# Patient Record
Sex: Female | Born: 1935 | Race: White | Hispanic: No | Smoking: Former smoker
Health system: Southern US, Community
[De-identification: ages and names within clinical notes are randomized; demographics above are authoritative.]

## PROBLEM LIST (undated history)

## (undated) DIAGNOSIS — F419 Anxiety disorder, unspecified: Secondary | ICD-10-CM

## (undated) DIAGNOSIS — I4891 Unspecified atrial fibrillation: Secondary | ICD-10-CM

## (undated) DIAGNOSIS — I10 Essential (primary) hypertension: Secondary | ICD-10-CM

## (undated) DIAGNOSIS — K219 Gastro-esophageal reflux disease without esophagitis: Secondary | ICD-10-CM

## (undated) HISTORY — PX: CHOLECYSTECTOMY: SHX55

## (undated) HISTORY — PX: ABDOMINAL HYSTERECTOMY: SHX81

---

## 2005-06-21 ENCOUNTER — Ambulatory Visit: Payer: Self-pay | Admitting: Cardiology

## 2005-06-24 ENCOUNTER — Ambulatory Visit: Payer: Self-pay | Admitting: Cardiology

## 2006-09-13 ENCOUNTER — Ambulatory Visit (HOSPITAL_COMMUNITY): Admission: RE | Admit: 2006-09-13 | Discharge: 2006-09-13 | Payer: Self-pay | Admitting: Family Medicine

## 2006-10-05 ENCOUNTER — Ambulatory Visit: Payer: Self-pay | Admitting: Orthopedic Surgery

## 2006-11-28 ENCOUNTER — Ambulatory Visit (HOSPITAL_COMMUNITY): Admission: RE | Admit: 2006-11-28 | Discharge: 2006-11-28 | Payer: Self-pay | Admitting: Oral Surgery

## 2007-10-30 ENCOUNTER — Ambulatory Visit (HOSPITAL_COMMUNITY): Admission: RE | Admit: 2007-10-30 | Discharge: 2007-10-30 | Payer: Self-pay | Admitting: Family Medicine

## 2007-11-12 ENCOUNTER — Ambulatory Visit (HOSPITAL_COMMUNITY): Admission: RE | Admit: 2007-11-12 | Discharge: 2007-11-12 | Payer: Self-pay | Admitting: Orthopedic Surgery

## 2010-09-24 ENCOUNTER — Other Ambulatory Visit (HOSPITAL_COMMUNITY): Payer: Self-pay | Admitting: Family Medicine

## 2010-09-24 DIAGNOSIS — G629 Polyneuropathy, unspecified: Secondary | ICD-10-CM

## 2010-09-24 DIAGNOSIS — M1990 Unspecified osteoarthritis, unspecified site: Secondary | ICD-10-CM

## 2010-10-01 ENCOUNTER — Ambulatory Visit (HOSPITAL_COMMUNITY)
Admission: RE | Admit: 2010-10-01 | Discharge: 2010-10-01 | Disposition: A | Discharge status: Home / Self Care | Payer: Medicare Other | Source: Ambulatory Visit | Attending: Family Medicine | Admitting: Family Medicine

## 2010-10-01 DIAGNOSIS — M1990 Unspecified osteoarthritis, unspecified site: Secondary | ICD-10-CM

## 2010-10-01 DIAGNOSIS — M545 Low back pain, unspecified: Secondary | ICD-10-CM | POA: Insufficient documentation

## 2010-10-01 DIAGNOSIS — R209 Unspecified disturbances of skin sensation: Secondary | ICD-10-CM | POA: Insufficient documentation

## 2010-10-01 DIAGNOSIS — M519 Unspecified thoracic, thoracolumbar and lumbosacral intervertebral disc disorder: Secondary | ICD-10-CM | POA: Insufficient documentation

## 2010-10-01 DIAGNOSIS — M5137 Other intervertebral disc degeneration, lumbosacral region: Secondary | ICD-10-CM | POA: Insufficient documentation

## 2010-10-01 DIAGNOSIS — R609 Edema, unspecified: Secondary | ICD-10-CM | POA: Insufficient documentation

## 2010-10-01 DIAGNOSIS — M5126 Other intervertebral disc displacement, lumbar region: Secondary | ICD-10-CM | POA: Insufficient documentation

## 2010-10-01 DIAGNOSIS — G629 Polyneuropathy, unspecified: Secondary | ICD-10-CM

## 2010-10-01 DIAGNOSIS — M51379 Other intervertebral disc degeneration, lumbosacral region without mention of lumbar back pain or lower extremity pain: Secondary | ICD-10-CM | POA: Insufficient documentation

## 2010-11-08 ENCOUNTER — Other Ambulatory Visit: Payer: Self-pay | Admitting: Neurosurgery

## 2010-11-08 DIAGNOSIS — M47816 Spondylosis without myelopathy or radiculopathy, lumbar region: Secondary | ICD-10-CM

## 2010-11-09 ENCOUNTER — Ambulatory Visit
Admission: RE | Admit: 2010-11-09 | Discharge: 2010-11-09 | Disposition: A | Discharge status: Home / Self Care | Payer: Medicare Other | Source: Ambulatory Visit | Attending: Neurosurgery | Admitting: Neurosurgery

## 2010-11-09 DIAGNOSIS — M47816 Spondylosis without myelopathy or radiculopathy, lumbar region: Secondary | ICD-10-CM

## 2010-12-10 ENCOUNTER — Other Ambulatory Visit: Payer: Self-pay | Admitting: Neurosurgery

## 2010-12-10 DIAGNOSIS — M47816 Spondylosis without myelopathy or radiculopathy, lumbar region: Secondary | ICD-10-CM

## 2010-12-20 ENCOUNTER — Ambulatory Visit
Admission: RE | Admit: 2010-12-20 | Discharge: 2010-12-20 | Disposition: A | Discharge status: Home / Self Care | Payer: Medicare Other | Source: Ambulatory Visit | Attending: Neurosurgery | Admitting: Neurosurgery

## 2010-12-20 DIAGNOSIS — M47816 Spondylosis without myelopathy or radiculopathy, lumbar region: Secondary | ICD-10-CM

## 2010-12-24 NOTE — Op Note (Signed)
NAME:  Brandi [name], Brandi [name]                ACCOUNT NO.:  [id number]   MEDICAL RECORD NO.:  [id number]          PATIENT TYPE:  AMB   LOCATION:  SDS                          FACILITY:  [facility]   PHYSICIAN:  Hewitt Blade, D.D.S.DATE OF BIRTH:  July 17, 1929   DATE OF PROCEDURE:  11/28/2006  DATE OF DISCHARGE:  11/28/2006                               OPERATIVE REPORT   PREOPERATIVE DIAGNOSES:  Maxillary and mandibular nonrestorable teeth,  #4, 5, 6, 7, 8, 9, 10, 11, 12, 20, 21, 22, 23, 24, 25, 26, 27, 28, 29,  30; history of atrial fibrillation; history of supraventricular  tachycardia; history of congestive heart failure; history of  hypertension; history of shortness of breath; and history of nervous  disorders.   POSTOPERATIVE DIAGNOSES:  Maxillary and mandibular nonrestorable teeth,  #4, 5, 6, 7, 8, 9, 10, 11, 12, 20, 21, 22, 23, 24, 25, 26, 27, 28, 29,  30; history of atrial fibrillation; history of supraventricular  tachycardia; history of congestive heart failure; history of  hypertension; history of shortness of breath; and history of nervous  disorders.   SURGERY PERFORMED:  Total odontectomy and maxillary and mandibular  alveoloplasties.   SURGEON:  Hewitt Blade, D.D.S.   FIRST ASSISTANT:  Earlene Plater.   ANESTHESIA:  General via orotracheal intubation.   ESTIMATED BLOOD LOSS:  Less than 50 mL.   FLUID REPLACEMENT:  Approximately 1000 mL crystalloid solution.   COMPLICATIONS:  None apparent.   INDICATIONS FOR PROCEDURE:  Brandi [name] is a 77 year old female, who was  referred to my office for evaluation and removal of her remaining  dentition.  The patient has suffered from chronic dental neglect and  periodontal disease.  The teeth were deemed nonrestorable.  The patient  has a medical history complicating the surgery, including chronic heart  disease; and due to this, it was highly recommended that the patient  have the procedure performed in an operating room, where the  cardiac  status could be closely monitored and controlled.   DETAILS OF PROCEDURE:  On 11/28/06, Brandi [name] was taken to [facility]  Main Operating Suite, where she was placed on the operating room table  in a supine position.  Following successful oroendotracheal intubation  and general anesthesia, the patient's face, neck and oral cavity were  prepped and draped in the usual sterile operating room fashion.  The  hypopharynx was suctioned free of fluids and secretions, and a moistened  2-inch vaginal pack was placed as a throat pack.   Attention was then directed intraorally, where approximately 8 mL of 3%  mepivacaine was infiltrated around the maxillary, buccal and palatal  soft tissues, and the right and left inferior alveolar neurovascular  regions.  Approximately 2 mL of 0.5% Xylocaine containing 1:200,000 of  epinephrine was then infiltrated along the gingival margins of the  dentition for hemostasis purposes.   Attention was then directed towards the maxillary arch, where a #15 Bard-  Parker blade was used to create a full-thickness mucoperiosteal incision  around the buccal and lingual aspects of the gingival tissue surrounding  the teeth.  A #9 Molt  periosteal elevator was then used to reflect a  full-thickness mucoperiosteal flap.  The maxillary teeth were then  subluxated from the alveolus using an 11A elevator, and then removed  from the oral cavity using a 150 dental forceps.  Mild alveoloplasty was  then performed using a Stryker rotary osteotome and a small round bur.   In a similar fashion, the mandibular tissue was removed and  alveoloplasty was performed.  The surgical areas were then copiously  irrigated with sterile saline irrigation solutions and suctioned.  The  mucoperiosteal margins were then approximated in an anatomic fashion and  sutured using 4-0 chromic suture material.  Previously constructed  dentures were placed.  The throat pack was removed, and the  hypopharynx  suctioned free of fluids and secretions.  The patient was then allowed  to awaken from the anesthesia, and was taken to the recovery room, where  she tolerated the procedure well and without apparent complication.           ______________________________  Hewitt Blade, D.D.S.     DC/MEDQ  D:  12/01/2006  T:  12/01/2006  Job:  [id number]

## 2012-05-24 ENCOUNTER — Other Ambulatory Visit: Payer: Self-pay | Admitting: Family Medicine

## 2012-05-24 DIAGNOSIS — Z1231 Encounter for screening mammogram for malignant neoplasm of breast: Secondary | ICD-10-CM

## 2016-08-04 ENCOUNTER — Emergency Department (HOSPITAL_COMMUNITY)
Admission: EM | Admit: 2016-08-04 | Discharge: 2016-08-04 | Disposition: A | Discharge status: Home / Self Care | Payer: Medicare Other | Attending: Emergency Medicine | Admitting: Emergency Medicine

## 2016-08-04 ENCOUNTER — Emergency Department (HOSPITAL_COMMUNITY): Payer: Medicare Other

## 2016-08-04 ENCOUNTER — Encounter (HOSPITAL_COMMUNITY): Payer: Self-pay | Admitting: *Deleted

## 2016-08-04 DIAGNOSIS — Z79899 Other long term (current) drug therapy: Secondary | ICD-10-CM | POA: Diagnosis not present

## 2016-08-04 DIAGNOSIS — M419 Scoliosis, unspecified: Secondary | ICD-10-CM

## 2016-08-04 DIAGNOSIS — M79662 Pain in left lower leg: Secondary | ICD-10-CM | POA: Insufficient documentation

## 2016-08-04 DIAGNOSIS — Z7982 Long term (current) use of aspirin: Secondary | ICD-10-CM | POA: Insufficient documentation

## 2016-08-04 DIAGNOSIS — M79605 Pain in left leg: Secondary | ICD-10-CM

## 2016-08-04 DIAGNOSIS — Y999 Unspecified external cause status: Secondary | ICD-10-CM | POA: Diagnosis not present

## 2016-08-04 DIAGNOSIS — Z791 Long term (current) use of non-steroidal anti-inflammatories (NSAID): Secondary | ICD-10-CM | POA: Diagnosis not present

## 2016-08-04 DIAGNOSIS — Y939 Activity, unspecified: Secondary | ICD-10-CM | POA: Diagnosis not present

## 2016-08-04 DIAGNOSIS — Y92009 Unspecified place in unspecified non-institutional (private) residence as the place of occurrence of the external cause: Secondary | ICD-10-CM | POA: Diagnosis not present

## 2016-08-04 DIAGNOSIS — W06XXXA Fall from bed, initial encounter: Secondary | ICD-10-CM | POA: Diagnosis not present

## 2016-08-04 DIAGNOSIS — Z87891 Personal history of nicotine dependence: Secondary | ICD-10-CM | POA: Diagnosis not present

## 2016-08-04 DIAGNOSIS — G8929 Other chronic pain: Secondary | ICD-10-CM

## 2016-08-04 DIAGNOSIS — I10 Essential (primary) hypertension: Secondary | ICD-10-CM | POA: Insufficient documentation

## 2016-08-04 DIAGNOSIS — S30810A Abrasion of lower back and pelvis, initial encounter: Secondary | ICD-10-CM | POA: Diagnosis not present

## 2016-08-04 DIAGNOSIS — M41126 Adolescent idiopathic scoliosis, lumbar region: Secondary | ICD-10-CM | POA: Diagnosis not present

## 2016-08-04 DIAGNOSIS — W19XXXA Unspecified fall, initial encounter: Secondary | ICD-10-CM

## 2016-08-04 DIAGNOSIS — S8992XA Unspecified injury of left lower leg, initial encounter: Secondary | ICD-10-CM | POA: Diagnosis present

## 2016-08-04 HISTORY — DX: Gastro-esophageal reflux disease without esophagitis: K21.9

## 2016-08-04 HISTORY — DX: Essential (primary) hypertension: I10

## 2016-08-04 HISTORY — DX: Anxiety disorder, unspecified: F41.9

## 2016-08-04 HISTORY — DX: Unspecified atrial fibrillation: I48.91

## 2016-08-04 LAB — CBC WITH DIFFERENTIAL/PLATELET
Basophils Absolute: 0 10*3/uL (ref 0.0–0.1)
Basophils Relative: 0 %
Eosinophils Absolute: 0.1 10*3/uL (ref 0.0–0.7)
Eosinophils Relative: 1 %
HEMATOCRIT: 35.2 % — AB (ref 36.0–46.0)
HEMOGLOBIN: 12.1 g/dL (ref 12.0–15.0)
LYMPHS ABS: 1.3 10*3/uL (ref 0.7–4.0)
Lymphocytes Relative: 19 %
MCH: 32.7 pg (ref 26.0–34.0)
MCHC: 34.4 g/dL (ref 30.0–36.0)
MCV: 95.1 fL (ref 78.0–100.0)
MONOS PCT: 11 %
Monocytes Absolute: 0.7 10*3/uL (ref 0.1–1.0)
NEUTROS ABS: 4.8 10*3/uL (ref 1.7–7.7)
NEUTROS PCT: 69 %
Platelets: 202 10*3/uL (ref 150–400)
RBC: 3.7 MIL/uL — AB (ref 3.87–5.11)
RDW: 13.6 % (ref 11.5–15.5)
WBC: 6.8 10*3/uL (ref 4.0–10.5)

## 2016-08-04 LAB — BASIC METABOLIC PANEL
ANION GAP: 8 (ref 5–15)
BUN: 12 mg/dL (ref 6–20)
CHLORIDE: 107 mmol/L (ref 101–111)
CO2: 26 mmol/L (ref 22–32)
Calcium: 9 mg/dL (ref 8.9–10.3)
Creatinine, Ser: 0.88 mg/dL (ref 0.44–1.00)
GFR calc non Af Amer: 58 mL/min — ABNORMAL LOW (ref >60)
Glucose, Bld: 114 mg/dL — ABNORMAL HIGH (ref 65–99)
Potassium: 3.2 mmol/L — ABNORMAL LOW (ref 3.5–5.1)
SODIUM: 141 mmol/L (ref 135–145)

## 2016-08-04 LAB — TROPONIN I

## 2016-08-04 MED ORDER — LIDOCAINE 5 % EX PTCH: 1.0000 | MEDICATED_PATCH | CUTANEOUS | 0 refills | Status: AC

## 2016-08-04 MED ORDER — LIDOCAINE 5 % EX PTCH
1.0000 | MEDICATED_PATCH | CUTANEOUS | Status: DC
  Administered 2016-08-04: 1 via TRANSDERMAL
  Filled 2016-08-04: qty 1

## 2016-08-04 MED ORDER — LIDOCAINE 5 % EX PTCH
MEDICATED_PATCH | CUTANEOUS | Status: AC
  Filled 2016-08-04: qty 1

## 2016-08-04 MED ORDER — IRBESARTAN 75 MG PO TABS
75.0000 mg | ORAL_TABLET | Freq: Every day | ORAL | Status: DC
  Administered 2016-08-04: 75 mg via ORAL
  Filled 2016-08-04 (×2): qty 1

## 2016-08-04 MED ORDER — TRAMADOL HCL 50 MG PO TABS
50.0000 mg | ORAL_TABLET | Freq: Once | ORAL | Status: AC
Start: 2016-08-04 — End: 2016-08-04
  Administered 2016-08-04: 50 mg via ORAL
  Filled 2016-08-04: qty 1

## 2016-08-04 MED ORDER — POTASSIUM CHLORIDE CRYS ER 20 MEQ PO TBCR
40.0000 meq | EXTENDED_RELEASE_TABLET | Freq: Once | ORAL | Status: AC
  Administered 2016-08-04: 40 meq via ORAL
  Filled 2016-08-04: qty 2

## 2016-08-04 MED ORDER — ALPRAZOLAM 0.5 MG PO TABS
1.0000 mg | ORAL_TABLET | Freq: Once | ORAL | Status: AC
  Administered 2016-08-04: 1 mg via ORAL
  Filled 2016-08-04: qty 2

## 2016-08-04 NOTE — ED Triage Notes (Signed)
Pt comes in for a fall (out of bed) that occurred on 12/24. States she called EMS and they got her out of the floor. Pt lives at home alone. Pt states she is hurting from her left hip down to her left foot. Pt states she is unsure if she hit her head when she fell.

## 2016-08-04 NOTE — ED Notes (Addendum)
Pt states that she fell out of her bed.  Her sister states that she has a high bed.  Pt has been taken advil with no relief.  Pt has a bruise on her left ankle down to her foot.

## 2016-08-04 NOTE — ED Notes (Signed)
Pt up and ambulated to the bathroom with one assist

## 2016-08-04 NOTE — ED Provider Notes (Signed)
[facility]-EMERGENCY DEPT Provider Note   CSN: [id number] Arrival date & time: 08/04/16  1341     History   Chief Complaint Chief Complaint  Patient presents with  . Fall    HPI Brandi [name] is a 82 y.o. female.  HPI  Brandi [name] is a 82 y.o. female who presents to the Emergency Department complaining of pain, bruising and swelling of her left lower leg.  She states that she accidentally fell out of her bed, striking her left side on the floor.  Incident occurred 4 days ago.  She contacted 911, they assisted her up, but she was not evaluated for the fall, stating that she thought "it would get better".  She comes to ED today complaining of worsening pain to her left lower leg, hip and foot.  She has been taking advil without relief.  Patient's sister states that she lives at home alone and she was not aware of her sister's injuries until today.  Patient denies LOC, back or neck pain, dizziness, vomiting, abd or chest pain, numbness or weakness of the extremities.     Past Medical History:  Diagnosis Date  . A-fib (HCC)   . Anxiety   . GERD (gastroesophageal reflux disease)   . Hypertension     There are no active problems to display for this patient.   Past Surgical History:  Procedure Laterality Date  . ABDOMINAL HYSTERECTOMY    . CHOLECYSTECTOMY      OB History    No data available       Home Medications    Prior to Admission medications   Not on File    Family History No family history on file.  Social History Social History  Substance Use Topics  . Smoking status: Former Games developermoker  . Smokeless tobacco: Never Used  . Alcohol use No     Allergies   Other; Sulfa antibiotics; and Penicillins   Review of Systems Review of Systems  Constitutional: Negative for chills and fever.  Eyes: Negative for visual disturbance.  Respiratory: Negative for chest tightness, shortness of breath and wheezing.   Cardiovascular: Negative for chest pain.    Gastrointestinal: Negative for abdominal pain, nausea and vomiting.  Genitourinary: Negative for difficulty urinating, dysuria and flank pain.  Musculoskeletal: Positive for arthralgias and joint swelling.  Skin: Negative for color change and wound.       Abrasion left buttock  Neurological: Negative for dizziness, syncope, weakness, numbness and headaches.  Psychiatric/Behavioral: Negative for confusion.  All other systems reviewed and are negative.    Physical Exam Updated Vital Signs BP 171/100 (BP Location: Left Arm)   Pulse 63   Temp 99 F (37.2 C) (Oral)   Resp 20   Ht 5' (1.524 m)   Wt 61.2 kg   SpO2 100%   BMI 26.37 kg/m   Physical Exam  Constitutional: She appears well-developed and well-nourished. No distress.  HENT:  Head: Atraumatic.  Mouth/Throat: Oropharynx is clear and moist.  Eyes: Conjunctivae and EOM are normal. Pupils are equal, round, and reactive to light.  Neck: Normal range of motion. No JVD present.  Cardiovascular: Normal rate, regular rhythm and intact distal pulses.   Pulmonary/Chest: Effort normal and breath sounds normal. No stridor. No respiratory distress. She exhibits no tenderness.  Abdominal: Soft. She exhibits no distension and no mass. There is no tenderness. There is no guarding.  Musculoskeletal: She exhibits tenderness. She exhibits no deformity.  Diffuse ttp of the left lower extremity  and foot.  Ecchymosis present.  Mild edema of the distal tib-fib.  No bony deformity.  Mild tenderness of the lateral left hip without bony deformity, shortening or external rotation.  Distal sensation intact  Lymphadenopathy:    She has no cervical adenopathy.  Skin: Skin is warm. No erythema.  3 cm abrasion tot he left lower buttock.  No hematoma.  Nursing note and vitals reviewed.    ED Treatments / Results  Labs (all labs ordered are listed, but only abnormal results are displayed) Labs Reviewed - No data to display  EKG  EKG  Interpretation None       Radiology Dg Lumbar Spine Complete  Result Date: 08/04/2016 CLINICAL DATA:  Low back and left hip pain since falling from bed 3 days ago. EXAM: LUMBAR SPINE - COMPLETE 4+ VIEW COMPARISON:  Radiographs 10/30/2007.  MRI 10/01/2010. FINDINGS: Numbering is as applied previously. The last open disc space is assigned L5-S1. Utilizing this numbering, there are small ribs at L1. The bones are demineralized. There is progressive multilevel spondylosis with a thoracolumbar scoliosis. No evidence of acute fracture or traumatic subluxation. Aortic and branch vessel atherosclerosis noted. IMPRESSION: Progressive scoliosis and spondylosis. No acute osseous findings seen. Electronically Signed   By: Carey BullocksWilliam  Veazey M.D.   On: 08/04/2016 15:43   Dg Tibia/fibula Left  Result Date: 08/04/2016 CLINICAL DATA:  Left lower extremity pain after falling out of bed 3 days ago. EXAM: LEFT TIBIA AND FIBULA - 2 VIEW COMPARISON:  None. FINDINGS: There is no evidence of fracture or other focal bone lesions. Soft tissues are unremarkable. Severe degenerative changes seen involving the left knee. IMPRESSION: No significant abnormality seen involving the left tibia or fibula. Electronically Signed   By: Lupita RaiderJames  Green Jr, M.D.   On: 08/04/2016 15:45   Ct Head Wo Contrast  Result Date: 08/04/2016 CLINICAL DATA:  Fall from bed. Uncertain about head injury. Initial encounter. EXAM: CT HEAD WITHOUT CONTRAST TECHNIQUE: Contiguous axial images were obtained from the base of the skull through the vertex without intravenous contrast. COMPARISON:  None. FINDINGS: Brain: No evidence of acute infarction, hemorrhage, hydrocephalus, extra-axial collection or mass lesion/mass effect. Mild for age chronic microvascular ischemic change in the cerebral white matter. Vascular: Atherosclerotic calcification. Skull: Negative Sinuses/Orbits: Negative IMPRESSION: No evidence of intracranial injury or fracture. Electronically  Signed   By: Marnee SpringJonathon  Watts M.D.   On: 08/04/2016 15:52   Dg Foot Complete Left  Result Date: 08/04/2016 CLINICAL DATA:  Left foot pain after falling out of bed 3 days ago. EXAM: LEFT FOOT - COMPLETE 3+ VIEW COMPARISON:  None. FINDINGS: No fracture or dislocation is noted. Joint spaces are intact. Spurring of posterior calcaneus is noted. Mild degenerative joint disease of the first metatarsophalangeal joint is noted. No soft tissue abnormality is noted. IMPRESSION: Mild degenerative joint disease of first metatarsophalangeal joint. No acute abnormality seen in the left foot. Electronically Signed   By: Lupita RaiderJames  Green Jr, M.D.   On: 08/04/2016 15:49   Dg Hip Unilat W Or Wo Pelvis 2-3 Views Left  Result Date: 08/04/2016 CLINICAL DATA:  Left hip pain after falling out of bed 3 days ago. EXAM: DG HIP (WITH OR WITHOUT PELVIS) 2-3V LEFT COMPARISON:  None. FINDINGS: There is no evidence of hip fracture or dislocation. There is no evidence of arthropathy or other focal bone abnormality. IMPRESSION: Normal left hip. Electronically Signed   By: Lupita RaiderJames  Green Jr, M.D.   On: 08/04/2016 15:43    Procedures Procedures (  including critical care time)  Medications Ordered in ED Medications  traMADol (ULTRAM) tablet 50 mg (50 mg Oral Given 08/04/16 1454)     Initial Impression / Assessment and Plan / ED Course  I have reviewed the triage vital signs and the nursing notes.  Pertinent labs & imaging results that were available during my care of the patient were reviewed by me and considered in my medical decision making (see chart for details).  Clinical Course     1500 pt also seen by Dr. Adriana Simas and care plan discussed.   Reviewed XR's results with the patient and family.  Pt is well appearing.  NV intact.  Likely contusions.  Pt does live at home alone.  I have contacted social worker and arranged for home health.  Pt and family agree to plan.  Pt will f/u with PMD.  Restrict return precautions also  given.    Just prior to dispo, pt began to complain of "feeling funny" stating "I don't feel like I'm breathing like normal"  Pt is speaking in complete sentences, no respiratory distress noted, smiling and alert.  No distress noted.  I have discussed this with Dr. Rush Landmark and I will order labs, EKG, CXR and pt placed on cardiac monitor.    1800 End of shift, pt signed out to [employer], PA-C.      Final Clinical Impressions(s) / ED Diagnoses   Final diagnoses:  None    New Prescriptions New Prescriptions   No medications on file     Pauline Aus, PA-C 08/04/16 1809    Donnetta Hutching, MD 08/05/16 [phone]

## 2016-08-04 NOTE — Discharge Instructions (Signed)
You may benefit from seeing a back or pain specialist for your persistent low back and left leg pain.  Discuss this with your doctor. Arrangements have been made for a home assessment to see if you can be helped with physical therapy, home health aid or other resources to help make you safer in your home.  You will be contacted with an appointment time for this.

## 2016-08-06 ENCOUNTER — Emergency Department (HOSPITAL_COMMUNITY): Payer: Medicare Other

## 2016-08-06 ENCOUNTER — Inpatient Hospital Stay (HOSPITAL_COMMUNITY)
Admission: EM | Admit: 2016-08-06 | Discharge: 2016-08-15 | DRG: 551 | Disposition: A | Discharge status: Skilled Nursing Facility | Payer: Medicare Other | Attending: Internal Medicine | Admitting: Internal Medicine

## 2016-08-06 ENCOUNTER — Encounter (HOSPITAL_COMMUNITY): Payer: Self-pay

## 2016-08-06 DIAGNOSIS — K219 Gastro-esophageal reflux disease without esophagitis: Secondary | ICD-10-CM | POA: Diagnosis present

## 2016-08-06 DIAGNOSIS — W19XXXA Unspecified fall, initial encounter: Secondary | ICD-10-CM

## 2016-08-06 DIAGNOSIS — M4800 Spinal stenosis, site unspecified: Secondary | ICD-10-CM | POA: Diagnosis present

## 2016-08-06 DIAGNOSIS — I4891 Unspecified atrial fibrillation: Secondary | ICD-10-CM | POA: Diagnosis present

## 2016-08-06 DIAGNOSIS — Z79899 Other long term (current) drug therapy: Secondary | ICD-10-CM

## 2016-08-06 DIAGNOSIS — M549 Dorsalgia, unspecified: Secondary | ICD-10-CM | POA: Diagnosis present

## 2016-08-06 DIAGNOSIS — W06XXXA Fall from bed, initial encounter: Secondary | ICD-10-CM | POA: Diagnosis present

## 2016-08-06 DIAGNOSIS — M5116 Intervertebral disc disorders with radiculopathy, lumbar region: Principal | ICD-10-CM | POA: Diagnosis present

## 2016-08-06 DIAGNOSIS — M6281 Muscle weakness (generalized): Secondary | ICD-10-CM

## 2016-08-06 DIAGNOSIS — B379 Candidiasis, unspecified: Secondary | ICD-10-CM | POA: Diagnosis present

## 2016-08-06 DIAGNOSIS — Z888 Allergy status to other drugs, medicaments and biological substances status: Secondary | ICD-10-CM

## 2016-08-06 DIAGNOSIS — Z880 Allergy status to penicillin: Secondary | ICD-10-CM

## 2016-08-06 DIAGNOSIS — R410 Disorientation, unspecified: Secondary | ICD-10-CM | POA: Diagnosis present

## 2016-08-06 DIAGNOSIS — R4182 Altered mental status, unspecified: Secondary | ICD-10-CM

## 2016-08-06 DIAGNOSIS — M79605 Pain in left leg: Secondary | ICD-10-CM | POA: Diagnosis present

## 2016-08-06 DIAGNOSIS — Z882 Allergy status to sulfonamides status: Secondary | ICD-10-CM

## 2016-08-06 DIAGNOSIS — R110 Nausea: Secondary | ICD-10-CM | POA: Diagnosis not present

## 2016-08-06 DIAGNOSIS — Z87891 Personal history of nicotine dependence: Secondary | ICD-10-CM

## 2016-08-06 DIAGNOSIS — F419 Anxiety disorder, unspecified: Secondary | ICD-10-CM

## 2016-08-06 DIAGNOSIS — I10 Essential (primary) hypertension: Secondary | ICD-10-CM | POA: Diagnosis not present

## 2016-08-06 DIAGNOSIS — G92 Toxic encephalopathy: Secondary | ICD-10-CM | POA: Diagnosis present

## 2016-08-06 LAB — COMPREHENSIVE METABOLIC PANEL
ALT: 15 U/L (ref 14–54)
ANION GAP: 9 (ref 5–15)
AST: 23 U/L (ref 15–41)
Albumin: 4.2 g/dL (ref 3.5–5.0)
Alkaline Phosphatase: 51 U/L (ref 38–126)
BILIRUBIN TOTAL: 0.7 mg/dL (ref 0.3–1.2)
BUN: 13 mg/dL (ref 6–20)
CO2: 23 mmol/L (ref 22–32)
CREATININE: 0.86 mg/dL (ref 0.44–1.00)
Calcium: 9.1 mg/dL (ref 8.9–10.3)
Chloride: 103 mmol/L (ref 101–111)
GFR calc non Af Amer: 60 mL/min — ABNORMAL LOW (ref >60)
Glucose, Bld: 100 mg/dL — ABNORMAL HIGH (ref 65–99)
Potassium: 3.7 mmol/L (ref 3.5–5.1)
Sodium: 135 mmol/L (ref 135–145)
TOTAL PROTEIN: 6.5 g/dL (ref 6.5–8.1)

## 2016-08-06 LAB — CBC WITH DIFFERENTIAL/PLATELET
BASOS ABS: 0 10*3/uL (ref 0.0–0.1)
BASOS PCT: 0 %
EOS ABS: 0.1 10*3/uL (ref 0.0–0.7)
Eosinophils Relative: 1 %
HEMATOCRIT: 35.2 % — AB (ref 36.0–46.0)
HEMOGLOBIN: 12.3 g/dL (ref 12.0–15.0)
Lymphocytes Relative: 14 %
Lymphs Abs: 1 10*3/uL (ref 0.7–4.0)
MCH: 32.5 pg (ref 26.0–34.0)
MCHC: 34.9 g/dL (ref 30.0–36.0)
MCV: 92.9 fL (ref 78.0–100.0)
MONO ABS: 0.7 10*3/uL (ref 0.1–1.0)
MONOS PCT: 11 %
NEUTROS PCT: 74 %
Neutro Abs: 5.2 10*3/uL (ref 1.7–7.7)
Platelets: 209 10*3/uL (ref 150–400)
RBC: 3.79 MIL/uL — ABNORMAL LOW (ref 3.87–5.11)
RDW: 13.8 % (ref 11.5–15.5)
WBC: 7 10*3/uL (ref 4.0–10.5)

## 2016-08-06 LAB — URINALYSIS, ROUTINE W REFLEX MICROSCOPIC
BILIRUBIN URINE: NEGATIVE
Glucose, UA: NEGATIVE mg/dL
Hgb urine dipstick: NEGATIVE
KETONES UR: 5 mg/dL — AB
LEUKOCYTES UA: NEGATIVE
NITRITE: NEGATIVE
Protein, ur: NEGATIVE mg/dL
Specific Gravity, Urine: 1.009 (ref 1.005–1.030)
pH: 5 (ref 5.0–8.0)

## 2016-08-06 MED ORDER — MORPHINE SULFATE (PF) 4 MG/ML IV SOLN
4.0000 mg | Freq: Once | INTRAVENOUS | Status: AC
  Administered 2016-08-06: 4 mg via INTRAVENOUS
  Filled 2016-08-06: qty 1

## 2016-08-06 MED ORDER — DEXAMETHASONE 4 MG PO TABS
10.0000 mg | ORAL_TABLET | Freq: Four times a day (QID) | ORAL | Status: DC
  Administered 2016-08-06 – 2016-08-08 (×7): 10 mg via ORAL
  Filled 2016-08-06 (×7): qty 3

## 2016-08-06 MED ORDER — METHYLPREDNISOLONE SODIUM SUCC 125 MG IJ SOLR
125.0000 mg | Freq: Once | INTRAMUSCULAR | Status: AC
  Administered 2016-08-06: 125 mg via INTRAVENOUS
  Filled 2016-08-06: qty 2

## 2016-08-06 MED ORDER — FAMOTIDINE 20 MG PO TABS
20.0000 mg | ORAL_TABLET | Freq: Two times a day (BID) | ORAL | Status: DC
  Administered 2016-08-06 – 2016-08-08 (×5): 20 mg via ORAL
  Filled 2016-08-06 (×5): qty 1

## 2016-08-06 MED ORDER — METOPROLOL SUCCINATE ER 50 MG PO TB24
100.0000 mg | ORAL_TABLET | Freq: Every evening | ORAL | Status: DC
  Administered 2016-08-06 – 2016-08-14 (×8): 100 mg via ORAL
  Filled 2016-08-06 (×8): qty 2

## 2016-08-06 MED ORDER — MORPHINE SULFATE (PF) 2 MG/ML IV SOLN
1.0000 mg | INTRAVENOUS | Status: DC | PRN
  Administered 2016-08-08: 1 mg via INTRAVENOUS
  Filled 2016-08-06 (×2): qty 1

## 2016-08-06 MED ORDER — SODIUM CHLORIDE 0.9% FLUSH
3.0000 mL | Freq: Two times a day (BID) | INTRAVENOUS | Status: DC
  Administered 2016-08-06 – 2016-08-14 (×13): 3 mL via INTRAVENOUS

## 2016-08-06 MED ORDER — IBUPROFEN 400 MG PO TABS
400.0000 mg | ORAL_TABLET | Freq: Four times a day (QID) | ORAL | Status: DC
  Administered 2016-08-06 – 2016-08-08 (×10): 400 mg via ORAL
  Filled 2016-08-06 (×9): qty 1

## 2016-08-06 MED ORDER — SODIUM CHLORIDE 0.9 % IV SOLN: 250.0000 mL | INTRAVENOUS | Status: DC | PRN

## 2016-08-06 MED ORDER — MORPHINE SULFATE (PF) 2 MG/ML IV SOLN
2.0000 mg | Freq: Once | INTRAVENOUS | Status: AC
  Administered 2016-08-06: 2 mg via INTRAVENOUS
  Filled 2016-08-06: qty 1

## 2016-08-06 MED ORDER — ONDANSETRON HCL 4 MG/2ML IJ SOLN
4.0000 mg | Freq: Once | INTRAMUSCULAR | Status: AC
  Administered 2016-08-06: 4 mg via INTRAVENOUS
  Filled 2016-08-06: qty 2

## 2016-08-06 MED ORDER — ASPIRIN 81 MG PO CHEW
81.0000 mg | CHEWABLE_TABLET | Freq: Every day | ORAL | Status: DC
  Administered 2016-08-07 – 2016-08-11 (×4): 81 mg via ORAL
  Filled 2016-08-06 (×5): qty 1

## 2016-08-06 MED ORDER — ACETAMINOPHEN 650 MG RE SUPP: 650.0000 mg | Freq: Four times a day (QID) | RECTAL | Status: DC | PRN

## 2016-08-06 MED ORDER — ALPRAZOLAM 1 MG PO TABS
1.0000 mg | ORAL_TABLET | Freq: Two times a day (BID) | ORAL | Status: DC
  Administered 2016-08-06 – 2016-08-08 (×5): 1 mg via ORAL
  Filled 2016-08-06 (×5): qty 1

## 2016-08-06 MED ORDER — ENOXAPARIN SODIUM 40 MG/0.4ML ~~LOC~~ SOLN
40.0000 mg | SUBCUTANEOUS | Status: DC
  Administered 2016-08-06 – 2016-08-14 (×8): 40 mg via SUBCUTANEOUS
  Filled 2016-08-06 (×8): qty 0.4

## 2016-08-06 MED ORDER — LIDOCAINE 5 % EX PTCH
1.0000 | MEDICATED_PATCH | CUTANEOUS | Status: DC
  Administered 2016-08-07 – 2016-08-15 (×9): 1 via TRANSDERMAL
  Filled 2016-08-06 (×9): qty 1

## 2016-08-06 MED ORDER — VITAMIN D3 25 MCG (1000 UNIT) PO TABS
5000.0000 [IU] | ORAL_TABLET | Freq: Every day | ORAL | Status: DC
  Administered 2016-08-07 – 2016-08-15 (×7): 5000 [IU] via ORAL
  Filled 2016-08-06 (×8): qty 5

## 2016-08-06 MED ORDER — IRBESARTAN 75 MG PO TABS
75.0000 mg | ORAL_TABLET | Freq: Every day | ORAL | Status: DC
  Administered 2016-08-08 – 2016-08-15 (×7): 75 mg via ORAL
  Filled 2016-08-06 (×9): qty 1

## 2016-08-06 MED ORDER — SODIUM CHLORIDE 0.9% FLUSH: 3.0000 mL | INTRAVENOUS | Status: DC | PRN

## 2016-08-06 MED ORDER — ADULT MULTIVITAMIN W/MINERALS CH
1.0000 | ORAL_TABLET | Freq: Every day | ORAL | Status: DC
  Administered 2016-08-07 – 2016-08-15 (×8): 1 via ORAL
  Filled 2016-08-06 (×9): qty 1

## 2016-08-06 MED ORDER — B COMPLEX-C PO TABS
1.0000 | ORAL_TABLET | Freq: Every day | ORAL | Status: DC
  Administered 2016-08-07 – 2016-08-15 (×8): 1 via ORAL
  Filled 2016-08-06 (×9): qty 1

## 2016-08-06 MED ORDER — ACETAMINOPHEN 325 MG PO TABS
650.0000 mg | ORAL_TABLET | Freq: Four times a day (QID) | ORAL | Status: DC | PRN
  Administered 2016-08-08 – 2016-08-14 (×8): 650 mg via ORAL
  Filled 2016-08-06 (×9): qty 2

## 2016-08-06 NOTE — ED Notes (Signed)
Bed: WA17 Expected date:  Expected time:  Means of arrival:  Comments: 82 yo leg pain x6 days

## 2016-08-06 NOTE — ED Triage Notes (Signed)
She c/o left leg pain (her entire left leg, including hip and lower left flank area) which she states she has had "for a long time". She also tells me she fell Christmas Eve. At home, and afterward "the pain was worse". She arrives here awake, alert and in no distress. She was seen at [facility] after the fall and underwent blood testing and x-rays.

## 2016-08-06 NOTE — ED Provider Notes (Signed)
[facility]-EMERGENCY DEPT Provider Note   CSN: [id number] Arrival date & time: 08/06/16  1100     History   Chief Complaint Chief Complaint  Patient presents with  . Fall  . Leg Pain    HPI Brandi [name] is a 82 y.o. female.  The history is provided by the patient and a relative. No language interpreter was used.  Fall   Leg Pain     Brandi [name] is a 82 y.o. female who presents to the Emergency Department complaining of leg pain.  She presents for evaluation of left leg and back pain that occurred after a fall on Christmas Eve. She was in bed when she rolled out of bed and woke up on the floor. She's had severe pain in her low back and bilateral legs, left greater than right since that time. She was seen in the emergency department 2 days ago for persistent worsening symptoms and had x-rays performed and was discharged home. She states there is no relief with her medications and she can no longer move the leg and has severe associated pain. She has nausea and decreased oral intake. No fevers, vomiting, diarrhea, dysuria. She does have a history of back pain in the past with some mild left-sided weakness. Her weakness is much worse than previously. She has been trying ibuprofen and Lidoderm patches at home with no relief in her symptoms. She lives alone. Past Medical History:  Diagnosis Date  . A-fib (HCC)   . Anxiety   . GERD (gastroesophageal reflux disease)   . Hypertension     There are no active problems to display for this patient.   Past Surgical History:  Procedure Laterality Date  . ABDOMINAL HYSTERECTOMY    . CHOLECYSTECTOMY      OB History    No data available       Home Medications    Prior to Admission medications   Medication Sig Start Date End Date Taking? Authorizing Provider  ALPRAZolam Prudy Feeler) 1 MG tablet Take 1 mg by mouth 2 (two) times daily.    Historical Provider, MD  aspirin 81 MG chewable tablet Chew by mouth daily.    Historical Provider,  MD  ibuprofen (ADVIL,MOTRIN) 200 MG tablet Take 600 mg by mouth 4 (four) times daily.     Historical Provider, MD  lidocaine (LIDODERM) 5 % Place 1 patch onto the skin daily. Replace daily to lower back. 08/04/16   Burgess Amor, PA-C  metoprolol succinate (TOPROL-XL) 100 MG 24 hr tablet Take 100 mg by mouth every evening. Take with or immediately following a meal.     Historical Provider, MD  ranitidine (ZANTAC) 150 MG capsule Take 150 mg by mouth 2 (two) times daily.    Historical Provider, MD  valsartan (DIOVAN) 80 MG tablet Take 80 mg by mouth daily.    Historical Provider, MD    Family History No family history on file.  Social History Social History  Substance Use Topics  . Smoking status: Former Games developer  . Smokeless tobacco: Never Used  . Alcohol use No     Allergies   Other; Sulfa antibiotics; and Penicillins   Review of Systems Review of Systems  All other systems reviewed and are negative.    Physical Exam Updated Vital Signs BP 186/96 (BP Location: Right Arm)   Pulse 63   Temp 98.6 F (37 C) (Oral)   Resp 18   SpO2 99%   Physical Exam  Constitutional: She is oriented to  person, place, and time. She appears well-developed and well-nourished.  HENT:  Head: Normocephalic and atraumatic.  Cardiovascular: Normal rate and regular rhythm.   No murmur heard. Pulmonary/Chest: Effort normal and breath sounds normal. No respiratory distress.  Abdominal: Soft. There is no tenderness. There is no rebound and no guarding.  Musculoskeletal:  2+ femoral pulses bilaterally. Ecchymosis and swelling to the left distal lateral leg and foot. There is diffuse tenderness throughout the foot, ankle, calf, knee, thigh, hip, low back.  Neurological: She is alert and oriented to person, place, and time.  Diffuse pain to touch throughout the left lower extremity. Patient refuses to move the left lower extremity but it is seen to move little bit on examination.  Skin: Skin is warm and  dry.  Psychiatric: She has a normal mood and affect. Her behavior is normal.  Nursing note and vitals reviewed.    ED Treatments / Results  Labs (all labs ordered are listed, but only abnormal results are displayed) Labs Reviewed  COMPREHENSIVE METABOLIC PANEL  CBC WITH DIFFERENTIAL/PLATELET  URINALYSIS, ROUTINE W REFLEX MICROSCOPIC    EKG  EKG Interpretation None       Radiology Dg Lumbar Spine Complete  Result Date: 08/04/2016 CLINICAL DATA:  Low back and left hip pain since falling from bed 3 days ago. EXAM: LUMBAR SPINE - COMPLETE 4+ VIEW COMPARISON:  Radiographs 10/30/2007.  MRI 10/01/2010. FINDINGS: Numbering is as applied previously. The last open disc space is assigned L5-S1. Utilizing this numbering, there are small ribs at L1. The bones are demineralized. There is progressive multilevel spondylosis with a thoracolumbar scoliosis. No evidence of acute fracture or traumatic subluxation. Aortic and branch vessel atherosclerosis noted. IMPRESSION: Progressive scoliosis and spondylosis. No acute osseous findings seen. Electronically Signed   By: Carey BullocksWilliam  Veazey M.D.   On: 08/04/2016 15:43   Dg Tibia/fibula Left  Result Date: 08/04/2016 CLINICAL DATA:  Left lower extremity pain after falling out of bed 3 days ago. EXAM: LEFT TIBIA AND FIBULA - 2 VIEW COMPARISON:  None. FINDINGS: There is no evidence of fracture or other focal bone lesions. Soft tissues are unremarkable. Severe degenerative changes seen involving the left knee. IMPRESSION: No significant abnormality seen involving the left tibia or fibula. Electronically Signed   By: Lupita RaiderJames  Green Jr, M.D.   On: 08/04/2016 15:45   Ct Head Wo Contrast  Result Date: 08/04/2016 CLINICAL DATA:  Fall from bed. Uncertain about head injury. Initial encounter. EXAM: CT HEAD WITHOUT CONTRAST TECHNIQUE: Contiguous axial images were obtained from the base of the skull through the vertex without intravenous contrast. COMPARISON:  None.  FINDINGS: Brain: No evidence of acute infarction, hemorrhage, hydrocephalus, extra-axial collection or mass lesion/mass effect. Mild for age chronic microvascular ischemic change in the cerebral white matter. Vascular: Atherosclerotic calcification. Skull: Negative Sinuses/Orbits: Negative IMPRESSION: No evidence of intracranial injury or fracture. Electronically Signed   By: Marnee SpringJonathon  Watts M.D.   On: 08/04/2016 15:52   Dg Chest Port 1 View  Result Date: 08/04/2016 CLINICAL DATA:  Pt c/o generalized CP and mild SOB after falling from her bed 4 days ago. Hx HTN, GERD, A-fib, former smoker EXAM: PORTABLE CHEST 1 VIEW COMPARISON:  11/24/2006 FINDINGS: Cardiac silhouette is normal in size. No mediastinal or hilar masses or evidence of adenopathy. Clear lungs.  No pleural effusion.  No pneumothorax. Skeletal structures are demineralized but grossly intact. IMPRESSION: No active disease. Electronically Signed   By: Amie Portlandavid  Ormond M.D.   On: 08/04/2016 18:16   Dg  Foot Complete Left  Result Date: 08/04/2016 CLINICAL DATA:  Left foot pain after falling out of bed 3 days ago. EXAM: LEFT FOOT - COMPLETE 3+ VIEW COMPARISON:  None. FINDINGS: No fracture or dislocation is noted. Joint spaces are intact. Spurring of posterior calcaneus is noted. Mild degenerative joint disease of the first metatarsophalangeal joint is noted. No soft tissue abnormality is noted. IMPRESSION: Mild degenerative joint disease of first metatarsophalangeal joint. No acute abnormality seen in the left foot. Electronically Signed   By: Lupita RaiderJames  Green Jr, M.D.   On: 08/04/2016 15:49   Dg Hip Unilat W Or Wo Pelvis 2-3 Views Left  Result Date: 08/04/2016 CLINICAL DATA:  Left hip pain after falling out of bed 3 days ago. EXAM: DG HIP (WITH OR WITHOUT PELVIS) 2-3V LEFT COMPARISON:  None. FINDINGS: There is no evidence of hip fracture or dislocation. There is no evidence of arthropathy or other focal bone abnormality. IMPRESSION: Normal left hip.  Electronically Signed   By: Lupita RaiderJames  Green Jr, M.D.   On: 08/04/2016 15:43    Procedures Procedures (including critical care time)  Medications Ordered in ED Medications  morphine 4 MG/ML injection 4 mg (not administered)  ondansetron (ZOFRAN) injection 4 mg (not administered)     Initial Impression / Assessment and Plan / ED Course  I have reviewed the triage vital signs and the nursing notes.  Pertinent labs & imaging results that were available during my care of the patient were reviewed by me and considered in my medical decision making (see chart for details).  Clinical Course     Patient here for evaluation of low back and left leg pain/weakness following a fall several days ago. She has significant pain on examination that limits strength testing. After pain medications in the emergency department she can demonstrate some strength in the leg. MRI demonstrates possible nerve root impingement. Discussed with neurosurgery, recommends pain control, steroids. Hospitalist consulted for admission for ongoing pain control.  Final Clinical Impressions(s) / ED Diagnoses   Final diagnoses:  Fall  Back pain    New Prescriptions New Prescriptions   No medications on file     Tilden FossaElizabeth Evonte Prestage, MD 08/09/16 [phone]

## 2016-08-06 NOTE — H&P (Signed)
[facility] History and Physical  Brandi [name] Pat UJW:[id number] DOB: 11-Dec-1928 DOA: 08/06/2016  PCP: Isabella StallingNDIEGO,[name], MD  Patient coming from: home  Chief Complaint: Back/leg pain  HPI: Brandi [name] Gearing is [name] 82 y.o. female with [name] medical history of hypertension, anxiety, presented to the emergency department with complaints of back and leg pain. Patient fell on Christmas Eve. She had presented to [facility] and received several x-rays as well as CT of the head, all unremarkable, and patient was discharged home. Patient states that she started having severe lower back pain as well as leg pain more so in the left. Patient states she's been unable to bear weight on her leg and has noticed that his started to bruise. She lives at home alone and states she is unable to take care of herself. Patient does endorse [name] history of lower back pain with some left-sided weakness however now it is become worse. She has been taking ibuprofen and using Lidoderm patches at home however no relief in her symptoms. She currently denies chest pain, shortness of breath, abdominal pain, nausea, vomiting, diarrhea, constipation, dizziness or headache, recent travel or illness.  ED Course: MRI lumbar spine showed disc extrusion. Solumedrol given. TR[facility] called for admission.   Review of Systems:  All other systems reviewed and are negative.   Past Medical History:  Diagnosis Date  . [name]-fib (HCC)   . Anxiety   . GERD (gastroesophageal reflux disease)   . Hypertension     Past Surgical History:  Procedure Laterality Date  . ABDOMINAL HYSTERECTOMY    . CHOLECYSTECTOMY      Social History:  reports that she has quit smoking. She has never used smokeless tobacco. She reports that she does not drink alcohol or use drugs. Lives at home alone.  Allergies  Allergen Reactions  . Other     Unable to take any kind of generic drug due to intolerance. causes her hiatal hernia to be very over active.  .  Peanut-Containing Drug Products Swelling  . Sulfa Antibiotics Hives    Dizziness   . Penicillins Swelling, Rash and Other (See Comments)    Has patient had [name] PCN reaction causing immediate rash, facial/tongue/throat swelling, SOB or lightheadedness with hypotension: Yes Has patient had [name] PCN reaction causing severe rash involving mucus membranes or skin necrosis: No Has patient had [name] PCN reaction that required hospitalization No Has patient had [name] PCN reaction occurring within the last 10 years:  If all of the above answers are "NO", then may proceed with Cephalosporin use.    No family history on file. No history of heart disease, or diabetes.   Prior to Admission medications   Medication Sig Start Date End Date Taking? Authorizing Provider  ALPRAZolam Prudy Feeler(XANAX) 1 MG tablet Take 1 mg by mouth 2 (two) times daily.   Yes Historical Provider, MD  aspirin 81 MG chewable tablet Chew by mouth daily.   Yes Historical Provider, MD  b complex vitamins tablet Take 1 tablet by mouth daily.   Yes Historical Provider, MD  Cholecalciferol (VITAMIN D3) 5000 units CAPS Take 5,000 Units by mouth daily.   Yes Historical Provider, MD  ibuprofen (ADVIL,MOTRIN) 200 MG tablet Take 400 mg by mouth 4 (four) times daily.    Yes Historical Provider, MD  lidocaine (LIDODERM) 5 % Place 1 patch onto the skin daily. Replace daily to lower back. 08/04/16  Yes Burgess AmorJulie Idol, PA-C  metoprolol succinate (TOPROL-XL) 100 MG 24 hr tablet Take  100 mg by mouth every evening. Take with or immediately following [name] meal.    Yes Historical Provider, MD  Multiple Vitamin (MULTIVITAMIN) tablet Take 1 tablet by mouth daily.   Yes Historical Provider, MD  ranitidine (ZANTAC) 150 MG capsule Take 150 mg by mouth 2 (two) times daily.   Yes Historical Provider, MD  valsartan (DIOVAN) 80 MG tablet Take 80 mg by mouth daily.   Yes Historical Provider, MD    Physical Exam: Vitals:   08/06/16 1449 08/06/16 1615  BP: 154/68 145/69  Pulse: (!) 57  65  Resp: 18 18  Temp:       General: Well developed, well nourished, NAD, appears stated age  HEENT: NCAT, PERRLA, EOMI, Anicteic Sclera, mucous membranes moist.   Neck: Supple, no JVD, no masses  Cardiovascular: S1 S2 auscultated, no rubs, murmurs or gallops. Regular rate and rhythm.  Respiratory: Clear to auscultation bilaterally with equal chest rise  Abdomen: Soft, nontender, nondistended, + bowel sounds  Extremities: warm dry without cyanosis clubbing or edema  Neuro: AAOx3, cranial nerves grossly intact. Strength decreased in LLE due to pain- patient hesitant when moving leg.   Skin: Without rashes exudates or nodules  Psych: Normal affect and demeanor with intact judgement and insight  Labs on Admission: I have personally reviewed following labs and imaging studies CBC:  Recent Labs Lab 08/04/16 1942 08/06/16 1243  WBC 6.8 7.0  NEUTROABS 4.8 5.2  HGB 12.1 12.3  HCT 35.2* 35.2*  MCV 95.1 92.9  PLT 202 209   Basic Metabolic Panel:  Recent Labs Lab 08/04/16 1942 08/06/16 1243  NA 141 135  K 3.2* 3.7  CL 107 103  CO2 26 23  GLUCOSE 114* 100*  BUN 12 13  CREATININE 0.88 0.86  CALCIUM 9.0 9.1   GFR: Estimated Creatinine Clearance: 39.1 mL/min (by C-G formula based on SCr of 0.86 mg/dL). Liver Function Tests:  Recent Labs Lab 08/06/16 1243  AST 23  ALT 15  ALKPHOS 51  BILITOT 0.7  PROT 6.5  ALBUMIN 4.2   No results for input(s): LIPASE, AMYLASE in the last 168 hours. No results for input(s): AMMONIA in the last 168 hours. Coagulation Profile: No results for input(s): INR, PROTIME in the last 168 hours. Cardiac Enzymes:  Recent Labs Lab 08/04/16 1942  TROPONINI <0.03   BNP (last 3 results) No results for input(s): PROBNP in the last 8760 hours. HbA1C: No results for input(s): HGBA1C in the last 72 hours. CBG: No results for input(s): GLUCAP in the last 168 hours. Lipid Profile: No results for input(s): CHOL, HDL, LDLCALC, TRIG,  CHOLHDL, LDLDIRECT in the last 72 hours. Thyroid Function Tests: No results for input(s): TSH, T4TOTAL, FREET4, T3FREE, THYROIDAB in the last 72 hours. Anemia Panel: No results for input(s): VITAMINB12, FOLATE, FERRITIN, TIBC, IRON, RETICCTPCT in the last 72 hours. Urine analysis: No results found for: COLORURINE, APPEARANCEUR, LABSPEC, PHURINE, GLUCOSEU, HGBUR, BILIRUBINUR, KETONESUR, PROTEINUR, UROBILINOGEN, NITRITE, LEUKOCYTESUR Sepsis Labs: @LABRCNTIP (procalcitonin:4,lacticidven:4) )No results found for this or any previous visit (from the past 240 hour(s)).   Radiological Exams on Admission: Mr Lumbar Spine Wo Contrast  Result Date: 08/06/2016 CLINICAL DATA:  82 year old female status post fall out of bed 6 days ago. Continued lumbar back pain radiating to the left hip and foot. Subsequent encounter. EXAM: MRI LUMBAR SPINE WITHOUT CONTRAST TECHNIQUE: Multiplanar, multisequence MR imaging of the lumbar spine was performed. No intravenous contrast was administered. COMPARISON:  Lumbar radiographs 08/04/2016.  Lumbar MRI 10/01/2010. FINDINGS: Segmentation: Normal as  demonstrated on the recent radiographs, and this is the same numbering system used on the 2012 MRI. Alignment: Moderate dextroconvex lumbar scoliosis is chronic and not significantly changed since 2012. Intermittent mild retrolisthesis in the lumbar spine appears stable. Vertebrae: No lumbar compression fracture. Moderate to severe chronic upper lumbar endplate spurring. No marrow edema or evidence of acute osseous abnormality. Visible sacrum and SI joints intact. Conus medullaris: Extends to the L1-L2 level and appears normal. Paraspinal and other soft tissues: Stable visualized abdominal viscera. There is mild edema in the left erector spinae muscles and also [name] small segment of the left psoas muscle at the L4 level (series 5, image 15). See the L4-L5 findings described below. Otherwise negative paraspinal soft tissues. Disc levels: No  lower thoracic spinal stenosis. T12-L1:  Stable mild disc bulge and facet hypertrophy. L1-L2: Chronic circumferential but right eccentric disc bulge. Progressed right far lateral disc and endplate spurring. Mild to moderate facet hypertrophy. Mild right lateral recess stenosis and mild right foraminal stenosis not significantly changed. L2-L3: Chronic circumferential disc bulge and endplate spurring eccentric to the left. Moderate facet hypertrophy. Chronic moderate to severe right and mild left lateral recess stenosis appears stable. Chronic moderate right L2 foraminal stenosis is stable. L3-L4: Left eccentric circumferential disc bulge and moderate facet hypertrophy greater on the left appears stable. Trace right facet joint fluid is chronic. Mild bilateral lateral recess stenosis is stable. L4-L5: New T2 and STIR heterogeneity within the disc space. Chronic circumferential and left eccentric disc bulge, but new moderate to large left foraminal disc extrusion, with disc fragment estimated at 15 mm. See series 3, image 11 and series 7, image 26. Superimposed severe chronic facet hypertrophy at this level with increased facet joint fluid. Severe left lateral recess stenosis has progressed. Moderate spinal stenosis has progressed. Very severe left L4 foraminal stenosis is new. L5-S1: Stable circumferential disc bulge with severe facet hypertrophy. Stable mild to moderate left lateral recess stenosis. Stable mild left L5 foraminal stenosis. IMPRESSION: 1. Symptomatic level felt to be L4-L5 where [name] large left foraminal disc extrusion is new since 2012. The extruded disc fragment is estimated at 15 mm and results in new severe left foraminal stenosis. Query left L4 radiculitis. 2. Multifactorial moderate to severe left lateral recess stenosis (descending left L5 nerve root level) and moderate spinal stenosis have also progressed at L4-L5. 3. Other levels appear stable since 2012 ; underlying moderate lumbar scoliosis  with widespread chronically advanced spinal degeneration. Electronically Signed   By: Odessa FlemingH  Hall M.D.   On: 08/06/2016 14:35   Dg Chest Port 1 View  Result Date: 08/04/2016 CLINICAL DATA:  Pt c/o generalized CP and mild SOB after falling from her bed 4 days ago. Hx HTN, GERD, [name]-fib, former smoker EXAM: PORTABLE CHEST 1 VIEW COMPARISON:  11/24/2006 FINDINGS: Cardiac silhouette is normal in size. No mediastinal or hilar masses or evidence of adenopathy. Clear lungs.  No pleural effusion.  No pneumothorax. Skeletal structures are demineralized but grossly intact. IMPRESSION: No active disease. Electronically Signed   By: Amie Portlandavid  Ormond M.D.   On: 08/04/2016 18:16    EKG:  None  Assessment/Plan  Back/Leg pain with ambulatory dysfunction -Patient had fall on Christmas Eve. [facility] to [facility] ago and was sent home.  -Presented with increased leg pain and cannot walk -MRI lumbar spine L4-L5 large left foraminal disc extrusion, left severe left foraminal stenosis -Spoke with neurosurgery, Dr. Jordan LikesPool, recommended decadron 10mg  q6hours.  Would like to try conservative  management first.  If patient improves, she can follow up with him as an outpatient. -PT and OT consulted (patient does live alone)  Essential hypertension -Continue metoprolol, valsartan  Anxiety -Continue Xanax   GERD -Continue Zantac  DVT prophylaxis: Lovenox  Code Status: Full  Family Communication: Sister at bedside. Admission, patients condition and plan of care including tests being ordered have been discussed with the patient and sister who indicate understanding and agree with the plan and Code Status.  Disposition Plan: TBD  Consults called: neurosurgery, Dr. Jordan Likes   Admission status: Observation   Time spent: 60 minutes  Kole Hilyard D.O. [facility] Pager [phone]  If 7PM-7AM, please contact night-coverage [email or web address] Password [email or web address] 08/06/2016, 4:50 PM

## 2016-08-07 DIAGNOSIS — I10 Essential (primary) hypertension: Secondary | ICD-10-CM | POA: Diagnosis not present

## 2016-08-07 DIAGNOSIS — M79605 Pain in left leg: Secondary | ICD-10-CM | POA: Diagnosis not present

## 2016-08-07 DIAGNOSIS — M549 Dorsalgia, unspecified: Secondary | ICD-10-CM | POA: Diagnosis not present

## 2016-08-07 DIAGNOSIS — K219 Gastro-esophageal reflux disease without esophagitis: Secondary | ICD-10-CM | POA: Diagnosis not present

## 2016-08-07 LAB — BASIC METABOLIC PANEL
Anion gap: 11 (ref 5–15)
BUN: 23 mg/dL — AB (ref 6–20)
CHLORIDE: 108 mmol/L (ref 101–111)
CO2: 22 mmol/L (ref 22–32)
Calcium: 9.3 mg/dL (ref 8.9–10.3)
Creatinine, Ser: 0.86 mg/dL (ref 0.44–1.00)
GFR calc Af Amer: >60 mL/min (ref >60)
GFR calc non Af Amer: 60 mL/min — ABNORMAL LOW (ref >60)
GLUCOSE: 126 mg/dL — AB (ref 65–99)
POTASSIUM: 4.5 mmol/L (ref 3.5–5.1)
Sodium: 141 mmol/L (ref 135–145)

## 2016-08-07 LAB — CBC
HEMATOCRIT: 38.5 % (ref 36.0–46.0)
Hemoglobin: 12.6 g/dL (ref 12.0–15.0)
MCH: 31 pg (ref 26.0–34.0)
MCHC: 32.7 g/dL (ref 30.0–36.0)
MCV: 94.6 fL (ref 78.0–100.0)
Platelets: 207 10*3/uL (ref 150–400)
RBC: 4.07 MIL/uL (ref 3.87–5.11)
RDW: 14.1 % (ref 11.5–15.5)
WBC: 7.2 10*3/uL (ref 4.0–10.5)

## 2016-08-07 MED ORDER — HYDRALAZINE HCL 25 MG PO TABS
25.0000 mg | ORAL_TABLET | Freq: Four times a day (QID) | ORAL | Status: DC | PRN
  Administered 2016-08-08 – 2016-08-13 (×5): 25 mg via ORAL
  Filled 2016-08-07 (×5): qty 1

## 2016-08-07 NOTE — Progress Notes (Signed)
[facility]  PROGRESS NOTE  RANATA LAUGHERY QMV:[id number] DOB: June 16, 1929 DOA: 08/06/2016 PCP: Isabella Stalling, MD   Brief HPI:    82 y.o. female with a medical history of hypertension, anxiety, presented to the emergency department with complaints of back and leg pain. Patient fell on Christmas Eve. She had presented to [facility] and received several x-rays as well as CT of the head, all unremarkable, and patient was discharged home. Patient states that she started having severe lower back pain as well as leg pain more so in the left. Patient states she's been unable to bear weight on her leg and has noticed that his started to bruise. She lives at home alone and states she is unable to take care of herself. Patient does endorse a history of lower back pain with some left-sided weakness however now it is become worse. She has been taking ibuprofen and using Lidoderm patches at home however no relief in her symptoms. She currently denies chest pain, shortness of breath, abdominal pain, nausea, vomiting, diarrhea, constipation, dizziness or headache, recent travel or illness.   Subjective   Patient complains of back pain.   Assessment/Plan:     Back/Leg pain with ambulatory dysfunction -Patient had fall on Christmas Eve. Presented to [facility] 2 days ago and was sent home.  -Presented with increased leg pain and cannot walk -MRI lumbar spine L4-L5 large left foraminal disc extrusion, left severe left foraminal stenosis -Dr Catha Gosselin spoke with neurosurgery, Dr. Jordan Likes, recommended decadron 10mg  q6hours.  Would like to try conservative management first.  If patient improves, she can follow up with him as an outpatient. -PT and OT consulted (patient does live alone)  Essential hypertension -Continue metoprolol, valsartan  Anxiety -Continue Xanax   GERD -Continue Zantac    DVT prophylaxis: Lovenox  Code Status: Full code  Family Communication: No family at  bedside  Disposition Plan: Pending  Improvement of back pain   Consultants:  None   Procedures:  None  Continuous infusions     Antibiotics:   Anti-infectives    None       Objective   Vitals:   08/06/16 1754 08/06/16 2120 08/07/16 0511 08/07/16 1500  BP:  (!) 150/91 (!) 155/73 (!) 180/62  Pulse:  82 (!) 53 (!) 58  Resp:  18 18 18   Temp:  99.2 F (37.3 C) 97.9 F (36.6 C) 98.6 F (37 C)  TempSrc:  Oral Oral Oral  SpO2:  94% 95% 98%  Weight: 64.4 kg (142 lb)     Height: 4\' 10"  (1.473 m)       Intake/Output Summary (Last 24 hours) at 08/07/16 1728 Last data filed at 08/07/16 1357  Gross per 24 hour  Intake              543 ml  Output              600 ml  Net              -57 ml   Filed Weights   08/06/16 1754  Weight: 64.4 kg (142 lb)     Physical Examination:  General exam: Appears calm and comfortable. Respiratory system: Clear to auscultation. Respiratory effort normal. Cardiovascular system:  RRR. No  murmurs, rubs, gallops. No pedal edema. GI system: Abdomen is nondistended, soft and nontender. No organomegaly.  Central nervous system. No focal neurological deficits. 5 x 5 power in all extremities. Skin: No rashes, lesions or ulcers. Psychiatry: Alert, oriented  x 3.Judgement and insight appear normal. Affect normal. Musculoskeletal- SLR positive in left leg    Data Reviewed: I have personally reviewed following labs and imaging studies  CBG: No results for input(s): GLUCAP in the last 168 hours.  CBC:  Recent Labs Lab 08/04/16 1942 08/06/16 1243 08/07/16 0501  WBC 6.8 7.0 7.2  NEUTROABS 4.8 5.2  --   HGB 12.1 12.3 12.6  HCT 35.2* 35.2* 38.5  MCV 95.1 92.9 94.6  PLT 202 209 207    Basic Metabolic Panel:  Recent Labs Lab 08/04/16 1942 08/06/16 1243 08/07/16 0501  NA 141 135 141  K 3.2* 3.7 4.5  CL 107 103 108  CO2 26 23 22   GLUCOSE 114* 100* 126*  BUN 12 13 23*  CREATININE 0.88 0.86 0.86  CALCIUM 9.0 9.1 9.3     No results found for this or any previous visit (from the past 240 hour(s)).   Liver Function Tests:  Recent Labs Lab 08/06/16 1243  AST 23  ALT 15  ALKPHOS 51  BILITOT 0.7  PROT 6.5  ALBUMIN 4.2   No results for input(s): LIPASE, AMYLASE in the last 168 hours. No results for input(s): AMMONIA in the last 168 hours.  Cardiac Enzymes:  Recent Labs Lab 08/04/16 1942  TROPONINI <0.03      Studies: Mr Lumbar Spine Wo Contrast  Result Date: 08/06/2016 CLINICAL DATA:  82 year old female status post fall out of bed 6 days ago. Continued lumbar back pain radiating to the left hip and foot. Subsequent encounter. EXAM: MRI LUMBAR SPINE WITHOUT CONTRAST TECHNIQUE: Multiplanar, multisequence MR imaging of the lumbar spine was performed. No intravenous contrast was administered. COMPARISON:  Lumbar radiographs 08/04/2016.  Lumbar MRI 10/01/2010. FINDINGS: Segmentation: Normal as demonstrated on the recent radiographs, and this is the same numbering system used on the 2012 MRI. Alignment: Moderate dextroconvex lumbar scoliosis is chronic and not significantly changed since 2012. Intermittent mild retrolisthesis in the lumbar spine appears stable. Vertebrae: No lumbar compression fracture. Moderate to severe chronic upper lumbar endplate spurring. No marrow edema or evidence of acute osseous abnormality. Visible sacrum and SI joints intact. Conus medullaris: Extends to the L1-L2 level and appears normal. Paraspinal and other soft tissues: Stable visualized abdominal viscera. There is mild edema in the left erector spinae muscles and also a small segment of the left psoas muscle at the L4 level (series 5, image 15). See the L4-L5 findings described below. Otherwise negative paraspinal soft tissues. Disc levels: No lower thoracic spinal stenosis. T12-L1:  Stable mild disc bulge and facet hypertrophy. L1-L2: Chronic circumferential but right eccentric disc bulge. Progressed right far lateral disc  and endplate spurring. Mild to moderate facet hypertrophy. Mild right lateral recess stenosis and mild right foraminal stenosis not significantly changed. L2-L3: Chronic circumferential disc bulge and endplate spurring eccentric to the left. Moderate facet hypertrophy. Chronic moderate to severe right and mild left lateral recess stenosis appears stable. Chronic moderate right L2 foraminal stenosis is stable. L3-L4: Left eccentric circumferential disc bulge and moderate facet hypertrophy greater on the left appears stable. Trace right facet joint fluid is chronic. Mild bilateral lateral recess stenosis is stable. L4-L5: New T2 and STIR heterogeneity within the disc space. Chronic circumferential and left eccentric disc bulge, but new moderate to large left foraminal disc extrusion, with disc fragment estimated at 15 mm. See series 3, image 11 and series 7, image 26. Superimposed severe chronic facet hypertrophy at this level with increased facet joint fluid. Severe left lateral  recess stenosis has progressed. Moderate spinal stenosis has progressed. Very severe left L4 foraminal stenosis is new. L5-S1: Stable circumferential disc bulge with severe facet hypertrophy. Stable mild to moderate left lateral recess stenosis. Stable mild left L5 foraminal stenosis. IMPRESSION: 1. Symptomatic level felt to be L4-L5 where a large left foraminal disc extrusion is new since 2012. The extruded disc fragment is estimated at 15 mm and results in new severe left foraminal stenosis. Query left L4 radiculitis. 2. Multifactorial moderate to severe left lateral recess stenosis (descending left L5 nerve root level) and moderate spinal stenosis have also progressed at L4-L5. 3. Other levels appear stable since 2012 ; underlying moderate lumbar scoliosis with widespread chronically advanced spinal degeneration. Electronically Signed   By: Odessa FlemingH  Hall M.D.   On: 08/06/2016 14:35    Scheduled Meds: . ALPRAZolam  1 mg Oral BID  . aspirin   81 mg Oral Daily  . B-complex with vitamin C  1 tablet Oral Daily  . cholecalciferol  5,000 Units Oral Daily  . dexamethasone  10 mg Oral Q6H  . enoxaparin (LOVENOX) injection  40 mg Subcutaneous Q24H  . famotidine  20 mg Oral BID  . ibuprofen  400 mg Oral QID  . irbesartan  75 mg Oral Daily  . lidocaine  1 patch Transdermal Q24H  . metoprolol succinate  100 mg Oral QPM  . multivitamin with minerals  1 tablet Oral Daily  . sodium chloride flush  3 mL Intravenous Q12H      Time spent: 25 min  [facility],Renell Allum S   [facility] Pager [phone]. If 7PM-7AM, please contact night-coverage at [email or web address], Office  [phone]  password [email or web address] 08/07/2016, 5:28 PM  LOS: 0 days

## 2016-08-07 NOTE — Evaluation (Signed)
Physical Therapy Evaluation Patient Details Name: Brandi [name] MRN: [id number] DOB: November 09, 1928 Today's Date: 08/07/2016   History of Present Illness  Brandi [name] is a 82 y.o. female with a medical history of hypertension, anxiety, presented to the emergency department with complaints of back and leg pain. Patient fell on Christmas Eve.  Clinical Impression  Pt admitted with above diagnosis. Pt currently with functional limitations due to the deficits listed below (see PT Problem List). *Pt will benefit from skilled PT to increase their independence and safety with mobility to allow discharge to the venue listed below.   Pt did very well with PT this am, will continue to follow; should be able to D/C with HHPT     Follow Up Recommendations Home health PT    Equipment Recommendations  Rolling walker with 5" wheels (petite)    Recommendations for Other Services       Precautions / Restrictions Precautions Precautions: Fall Restrictions Weight Bearing Restrictions: No      Mobility  Bed Mobility Overal bed mobility: Needs Assistance Bed Mobility: Supine to Sit     Supine to sit: Min guard     General bed mobility comments: for safety, min/guard for trunk to upright  Transfers Overall transfer level: Needs assistance Equipment used: Rolling walker (2 wheeled) Transfers: Sit to/from Stand Sit to Stand: Min guard;Min assist         General transfer comment: cues for hand placement, although pt does not comply for sit to stand  Ambulation/Gait Ambulation/Gait assistance: Min guard Ambulation Distance (Feet): 80 Feet Assistive device: Rolling walker (2 wheeled) Gait Pattern/deviations: Step-through pattern     General Gait Details: cues to push RW and not pick it up, close guarding during direction changes  Stairs            Wheelchair Mobility    Modified Rankin (Stroke Patients Only)       Balance Overall balance assessment: Needs assistance    Sitting balance-Leahy Scale: Good     Standing balance support: No upper extremity supported Standing balance-Leahy Scale: Fair                               Pertinent Vitals/Pain Pain Assessment: Faces Faces Pain Scale: Hurts a little bit Pain Location: right LE Pain Descriptors / Indicators: Sore Pain Intervention(s): Limited activity within patient's tolerance;Monitored during session    Home Living Family/patient expects to be discharged to:: Private residence Living Arrangements: Alone Available Help at Discharge: Family;Available PRN/intermittently Type of Home: House Home Access: Stairs to enter   Entrance Stairs-Number of Steps: 2 Home Layout: Two level;Able to live on main level with bedroom/bathroom Home Equipment: Wheelchair - manual;Walker - standard;Cane - single point Additional Comments: pt gave up driving because she could not reach the pedals anymore;  pt's sisters drive her as needed, live close by but can't stay 24/7    Prior Function Level of Independence: Independent               Hand Dominance        Extremity/Trunk Assessment   Upper Extremity Assessment Upper Extremity Assessment: Defer to OT evaluation    Lower Extremity Assessment Lower Extremity Assessment: LLE deficits/detail LLE Deficits / Details: grossly 3/5, AROM WFL       Communication   Communication: No difficulties  Cognition Arousal/Alertness: Awake/alert Behavior During Therapy: WFL for tasks assessed/performed Overall Cognitive Status: Within Functional Limits for tasks  assessed                      General Comments      Exercises General Exercises - Lower Extremity Ankle Circles/Pumps: AROM;Both;10 reps Heel Slides:  (pt reports doing HS on her own)   Assessment/Plan    PT Assessment Patient needs continued PT services  PT Problem List Decreased strength;Decreased range of motion;Decreased activity tolerance;Decreased  mobility;Decreased knowledge of use of DME          PT Treatment Interventions DME instruction;Gait training;Functional mobility training;Therapeutic activities;Therapeutic exercise;Patient/family education;Stair training    PT Goals (Current goals can be found in the Care Plan section)  Acute Rehab PT Goals Patient Stated Goal: to move around PT Goal Formulation: With patient Time For Goal Achievement: 08/11/16 Potential to Achieve Goals: Good    Frequency Min 4X/week   Barriers to discharge        Co-evaluation               End of Session Equipment Utilized During Treatment: Gait belt Activity Tolerance: Patient tolerated treatment well Patient left: with call bell/phone within reach;in chair;with chair alarm set      Functional Assessment Tool Used: clinical judgement Functional Limitation: Mobility: Walking and moving around Mobility: Walking and Moving Around Current Status [phone](G8978): At least 1 percent but less than 20 percent impaired, limited or restricted Mobility: Walking and Moving Around Goal Status [phone](G8979): At least 1 percent but less than 20 percent impaired, limited or restricted    Time: 1119-1145 PT Time Calculation (min) (ACUTE ONLY): 26 min   Charges:   PT Evaluation $PT Eval Low Complexity: 1 Procedure PT Treatments $Gait Training: 8-22 mins   PT G Codes:   PT G-Codes **NOT FOR INPATIENT CLASS** Functional Assessment Tool Used: clinical judgement Functional Limitation: Mobility: Walking and moving around Mobility: Walking and Moving Around Current Status ([id number](G8978): At least 1 percent but less than 20 percent impaired, limited or restricted Mobility: Walking and Moving Around Goal Status [phone](G8979): At least 1 percent but less than 20 percent impaired, limited or restricted    [facility] HospitalWILLIAMS,Zyasia Halbleib 08/07/2016, 1:45 PM

## 2016-08-08 DIAGNOSIS — Z79899 Other long term (current) drug therapy: Secondary | ICD-10-CM | POA: Diagnosis not present

## 2016-08-08 DIAGNOSIS — M5116 Intervertebral disc disorders with radiculopathy, lumbar region: Secondary | ICD-10-CM | POA: Diagnosis present

## 2016-08-08 DIAGNOSIS — R110 Nausea: Secondary | ICD-10-CM | POA: Diagnosis not present

## 2016-08-08 DIAGNOSIS — M79609 Pain in unspecified limb: Secondary | ICD-10-CM | POA: Diagnosis not present

## 2016-08-08 DIAGNOSIS — K219 Gastro-esophageal reflux disease without esophagitis: Secondary | ICD-10-CM | POA: Diagnosis present

## 2016-08-08 DIAGNOSIS — M549 Dorsalgia, unspecified: Secondary | ICD-10-CM | POA: Diagnosis present

## 2016-08-08 DIAGNOSIS — Z882 Allergy status to sulfonamides status: Secondary | ICD-10-CM | POA: Diagnosis not present

## 2016-08-08 DIAGNOSIS — Z880 Allergy status to penicillin: Secondary | ICD-10-CM | POA: Diagnosis not present

## 2016-08-08 DIAGNOSIS — I10 Essential (primary) hypertension: Secondary | ICD-10-CM | POA: Diagnosis present

## 2016-08-08 DIAGNOSIS — F411 Generalized anxiety disorder: Secondary | ICD-10-CM | POA: Diagnosis not present

## 2016-08-08 DIAGNOSIS — R609 Edema, unspecified: Secondary | ICD-10-CM | POA: Diagnosis not present

## 2016-08-08 DIAGNOSIS — G92 Toxic encephalopathy: Secondary | ICD-10-CM | POA: Diagnosis present

## 2016-08-08 DIAGNOSIS — F419 Anxiety disorder, unspecified: Secondary | ICD-10-CM | POA: Diagnosis present

## 2016-08-08 DIAGNOSIS — Z87891 Personal history of nicotine dependence: Secondary | ICD-10-CM | POA: Diagnosis not present

## 2016-08-08 DIAGNOSIS — R410 Disorientation, unspecified: Secondary | ICD-10-CM | POA: Diagnosis present

## 2016-08-08 DIAGNOSIS — M79605 Pain in left leg: Secondary | ICD-10-CM | POA: Diagnosis not present

## 2016-08-08 DIAGNOSIS — Z888 Allergy status to other drugs, medicaments and biological substances status: Secondary | ICD-10-CM | POA: Diagnosis not present

## 2016-08-08 DIAGNOSIS — B379 Candidiasis, unspecified: Secondary | ICD-10-CM | POA: Diagnosis present

## 2016-08-08 DIAGNOSIS — Z90710 Acquired absence of both cervix and uterus: Secondary | ICD-10-CM | POA: Diagnosis not present

## 2016-08-08 DIAGNOSIS — Z91010 Allergy to peanuts: Secondary | ICD-10-CM | POA: Diagnosis not present

## 2016-08-08 DIAGNOSIS — M4800 Spinal stenosis, site unspecified: Secondary | ICD-10-CM | POA: Diagnosis present

## 2016-08-08 DIAGNOSIS — R262 Difficulty in walking, not elsewhere classified: Secondary | ICD-10-CM | POA: Diagnosis not present

## 2016-08-08 DIAGNOSIS — W06XXXA Fall from bed, initial encounter: Secondary | ICD-10-CM | POA: Diagnosis present

## 2016-08-08 DIAGNOSIS — I4891 Unspecified atrial fibrillation: Secondary | ICD-10-CM | POA: Diagnosis present

## 2016-08-08 DIAGNOSIS — Z9049 Acquired absence of other specified parts of digestive tract: Secondary | ICD-10-CM | POA: Diagnosis not present

## 2016-08-08 LAB — URINALYSIS, ROUTINE W REFLEX MICROSCOPIC
BILIRUBIN URINE: NEGATIVE
GLUCOSE, UA: NEGATIVE mg/dL
Hgb urine dipstick: NEGATIVE
KETONES UR: NEGATIVE mg/dL
Leukocytes, UA: NEGATIVE
Nitrite: NEGATIVE
PH: 5 (ref 5.0–8.0)
Protein, ur: NEGATIVE mg/dL
SPECIFIC GRAVITY, URINE: 1.008 (ref 1.005–1.030)

## 2016-08-08 MED ORDER — ALPRAZOLAM 0.5 MG PO TABS
0.5000 mg | ORAL_TABLET | Freq: Once | ORAL | Status: AC
  Administered 2016-08-08: 17:00:00 0.5 mg via ORAL
  Filled 2016-08-08: qty 1

## 2016-08-08 MED ORDER — DEXAMETHASONE 4 MG PO TABS: 4.0000 mg | ORAL_TABLET | Freq: Four times a day (QID) | ORAL | Status: DC

## 2016-08-08 NOTE — Progress Notes (Signed)
CSW consulted for SNF placement. PN reviewed. PT is recommending HHPT at d/c. RNCM will assist with d/c planning needs.  Adama Ivins LCSW [phone] 

## 2016-08-08 NOTE — Progress Notes (Signed)
[facility]  PROGRESS NOTE  Brandi PaciniFrances A [name] ZOX:[id number] DOB: 02-27-1929 DOA: 08/06/2016 PCP: Isabella StallingNDIEGO,RICHARD M, MD   Brief HPI:    83 y.o. female with a medical history of hypertension, anxiety, presented to the emergency department with complaints of back and leg pain. Patient fell on Christmas Eve. She had presented to [facility] and received several x-rays as well as CT of the head, all unremarkable, and patient was discharged home. Patient states that she started having severe lower back pain as well as leg pain more so in the left. Patient states she's been unable to bear weight on her leg and has noticed that his started to bruise. She lives at home alone and states she is unable to take care of herself. Patient does endorse a history of lower back pain with some left-sided weakness however now it is become worse. She has been taking ibuprofen and using Lidoderm patches at home however no relief in her symptoms. She currently denies chest pain, shortness of breath, abdominal pain, nausea, vomiting, diarrhea, constipation, dizziness or headache, recent travel or illness.   Subjective   Patient says that pain has improved.   Assessment/Plan:     Back/Leg pain with ambulatory dysfunction -Patient had fall on Christmas Eve. Presented to [facility] days ago and was sent home.  -Presented with increased leg pain and cannot walk -MRI lumbar spine L4-L5 large left foraminal disc extrusion, left severe left foraminal stenosis -Dr Catha GosselinMikhail spoke with neurosurgery, Dr. Jordan LikesPool, recommended decadron 10mg  q6hours.  Will change Decadron to 4 mg every 6 hours Would like to try conservative management first.  If patient improves, she can follow up with him as an outpatient. -PT and OT consulted (patient does live alone), And recommended home health PT  Essential hypertension -Continue metoprolol, valsartan  Anxiety -Continue Xanax   GERD -Continue Zantac    DVT  prophylaxis: Lovenox  Code Status: Full code  Family Communication: No family at bedside  Disposition Plan: Pending  Improvement of back pain, Likely home in next 24 hours   Consultants:  None   Procedures:  None  Continuous infusions     Antibiotics:   Anti-infectives    None       Objective   Vitals:   08/07/16 1849 08/07/16 2218 08/08/16 0500 08/08/16 0704  BP: (!) 180/86 (!) 187/62 (!) 180/56 (!) 183/58  Pulse: 60 69 (!) 52 (!) 52  Resp:  18 16   Temp:  98.1 F (36.7 C) 97.7 F (36.5 C)   TempSrc:  Oral Oral   SpO2:  96% 95%   Weight:      Height:        Intake/Output Summary (Last 24 hours) at 08/08/16 1325 Last data filed at 08/08/16 1000  Gross per 24 hour  Intake              180 ml  Output              625 ml  Net             -445 ml   Filed Weights   08/06/16 1754  Weight: 64.4 kg (142 lb)     Physical Examination:  General exam: Appears calm and comfortable. Respiratory system: Clear to auscultation. Respiratory effort normal. Cardiovascular system:  RRR. No  murmurs, rubs, gallops. No pedal edema. GI system: Abdomen is nondistended, soft and nontender. No organomegaly.  Central nervous system. No focal neurological deficits. 5 x 5 power  in all extremities. Skin: No rashes, lesions or ulcers. Psychiatry: Alert, oriented x 3.Judgement and insight appear normal. Affect normal. Musculoskeletal- SLR positive in left leg    Data Reviewed: I have personally reviewed following labs and imaging studies  CBG: No results for input(s): GLUCAP in the last 168 hours.  CBC:  Recent Labs Lab 08/04/16 1942 08/06/16 1243 08/07/16 0501  WBC 6.8 7.0 7.2  NEUTROABS 4.8 5.2  --   HGB 12.1 12.3 12.6  HCT 35.2* 35.2* 38.5  MCV 95.1 92.9 94.6  PLT 202 209 207    Basic Metabolic Panel:  Recent Labs Lab 08/04/16 1942 08/06/16 1243 08/07/16 0501  NA 141 135 141  K 3.2* 3.7 4.5  CL 107 103 108  CO2 26 23 22   GLUCOSE 114* 100* 126*   BUN 12 13 23*  CREATININE 0.88 0.86 0.86  CALCIUM 9.0 9.1 9.3    No results found for this or any previous visit (from the past 240 hour(s)).   Liver Function Tests:  Recent Labs Lab 08/06/16 1243  AST 23  ALT 15  ALKPHOS 51  BILITOT 0.7  PROT 6.5  ALBUMIN 4.2   No results for input(s): LIPASE, AMYLASE in the last 168 hours. No results for input(s): AMMONIA in the last 168 hours.  Cardiac Enzymes:  Recent Labs Lab 08/04/16 1942  TROPONINI <0.03      Studies: Mr Lumbar Spine Wo Contrast  Result Date: 08/06/2016 CLINICAL DATA:  83 year old female status post fall out of bed 6 days ago. Continued lumbar back pain radiating to the left hip and foot. Subsequent encounter. EXAM: MRI LUMBAR SPINE WITHOUT CONTRAST TECHNIQUE: Multiplanar, multisequence MR imaging of the lumbar spine was performed. No intravenous contrast was administered. COMPARISON:  Lumbar radiographs 08/04/2016.  Lumbar MRI 10/01/2010. FINDINGS: Segmentation: Normal as demonstrated on the recent radiographs, and this is the same numbering system used on the 2012 MRI. Alignment: Moderate dextroconvex lumbar scoliosis is chronic and not significantly changed since 2012. Intermittent mild retrolisthesis in the lumbar spine appears stable. Vertebrae: No lumbar compression fracture. Moderate to severe chronic upper lumbar endplate spurring. No marrow edema or evidence of acute osseous abnormality. Visible sacrum and SI joints intact. Conus medullaris: Extends to the L1-L2 level and appears normal. Paraspinal and other soft tissues: Stable visualized abdominal viscera. There is mild edema in the left erector spinae muscles and also a small segment of the left psoas muscle at the L4 level (series 5, image 15). See the L4-L5 findings described below. Otherwise negative paraspinal soft tissues. Disc levels: No lower thoracic spinal stenosis. T12-L1:  Stable mild disc bulge and facet hypertrophy. L1-L2: Chronic circumferential  but right eccentric disc bulge. Progressed right far lateral disc and endplate spurring. Mild to moderate facet hypertrophy. Mild right lateral recess stenosis and mild right foraminal stenosis not significantly changed. L2-L3: Chronic circumferential disc bulge and endplate spurring eccentric to the left. Moderate facet hypertrophy. Chronic moderate to severe right and mild left lateral recess stenosis appears stable. Chronic moderate right L2 foraminal stenosis is stable. L3-L4: Left eccentric circumferential disc bulge and moderate facet hypertrophy greater on the left appears stable. Trace right facet joint fluid is chronic. Mild bilateral lateral recess stenosis is stable. L4-L5: New T2 and STIR heterogeneity within the disc space. Chronic circumferential and left eccentric disc bulge, but new moderate to large left foraminal disc extrusion, with disc fragment estimated at 15 mm. See series 3, image 11 and series 7, image 26. Superimposed severe chronic facet  hypertrophy at this level with increased facet joint fluid. Severe left lateral recess stenosis has progressed. Moderate spinal stenosis has progressed. Very severe left L4 foraminal stenosis is new. L5-S1: Stable circumferential disc bulge with severe facet hypertrophy. Stable mild to moderate left lateral recess stenosis. Stable mild left L5 foraminal stenosis. IMPRESSION: 1. Symptomatic level felt to be L4-L5 where a large left foraminal disc extrusion is new since 2012. The extruded disc fragment is estimated at 15 mm and results in new severe left foraminal stenosis. Query left L4 radiculitis. 2. Multifactorial moderate to severe left lateral recess stenosis (descending left L5 nerve root level) and moderate spinal stenosis have also progressed at L4-L5. 3. Other levels appear stable since 2012 ; underlying moderate lumbar scoliosis with widespread chronically advanced spinal degeneration. Electronically Signed   By: Odessa Fleming M.D.   On: 08/06/2016  14:35    Scheduled Meds: . ALPRAZolam  1 mg Oral BID  . aspirin  81 mg Oral Daily  . B-complex with vitamin C  1 tablet Oral Daily  . cholecalciferol  5,000 Units Oral Daily  . dexamethasone  10 mg Oral Q6H  . enoxaparin (LOVENOX) injection  40 mg Subcutaneous Q24H  . famotidine  20 mg Oral BID  . ibuprofen  400 mg Oral QID  . irbesartan  75 mg Oral Daily  . lidocaine  1 patch Transdermal Q24H  . metoprolol succinate  100 mg Oral QPM  . multivitamin with minerals  1 tablet Oral Daily  . sodium chloride flush  3 mL Intravenous Q12H      Time spent: 25 min  [facility]   [facility] Pager [phone]. If 7PM-7AM, please contact night-coverage at [email or web address], Office  [phone]  password [email or web address] 08/08/2016, 1:25 PM  LOS: 0 days

## 2016-08-08 NOTE — Evaluation (Signed)
Occupational Therapy Evaluation Patient Details Name: Brandi PaciniFrances A Odonell MRN: [id number] DOB: 1928-12-16 Today's Date: 08/08/2016    History of Present Illness Brandi [name] is a 83 y.o. female with a medical history of hypertension, anxiety, presented to the emergency department with complaints of back and leg pain. Patient fell on Christmas Eve.   Clinical Impression   Pt up with OT to perform functional transfer with walker to the bathroom to use commode. She requires much increased time to complete tasks as she tends to "teach back" each step of what she is doing. Attempted to provide redirection but pt continues to perseverate on educating on why she does things the way she does. She is focused on her own safety which is a good thing. Not clear how much help she will have upon return home. Will continue to follow for acute OT.    Follow Up Recommendations  Home health OT;Supervision/Assistance - 24 hour    Equipment Recommendations  None recommended by OT    Recommendations for Other Services       Precautions / Restrictions Precautions Precautions: Fall Restrictions Weight Bearing Restrictions: No      Mobility Bed Mobility Overal bed mobility: Needs Assistance Bed Mobility: Supine to Sit     Supine to sit: Supervision     General bed mobility comments:    Transfers Overall transfer level: Needs assistance Equipment used: Rolling walker (2 wheeled) Transfers: Sit to/from Stand Sit to Stand: Min guard         General transfer comment: She tend to pull up with walker despite cues to push up with at least one hand on the bed.     Balance     Sitting balance-Leahy Scale: Good     Standing balance support: No upper extremity supported;During functional activity;Bilateral upper extremity supported Standing balance-Leahy Scale: Fair                              ADL Overall ADL's : Needs assistance/impaired Eating/Feeding: Independent;Sitting    Grooming: Wash/dry hands;Set up;Sitting   Upper Body Bathing: Set up;Sitting   Lower Body Bathing: Min guard;Sit to/from stand   Upper Body Dressing : Set up;Sitting   Lower Body Dressing: Min guard;Sit to/from stand   Toilet Transfer: Min guard;Ambulation;Comfort height toilet;Grab bars;RW   Toileting- ArchitectClothing Manipulation and Hygiene: Min guard;Sit to/from stand         General ADL Comments: Pt is very methodical in how she completes tasks and tends to verbalize how she performs each step of task. Gave mod cues during session to redirect but she still tends to stop very frequently to explain why she perform the task the way she does. Took approximately 30 minutes to perform functional transfer to the bathroom and back to the recliner. She is very focused on her own safety which is a good thing. She was able to don her own L sock from bed level.      Vision     Perception     Praxis      Pertinent Vitals/Pain Pain Assessment: Faces Faces Pain Scale: No hurt Pain Descriptors / Indicators: Sore Pain Intervention(s): Monitored during session     Hand Dominance     Extremity/Trunk Assessment Upper Extremity Assessment Upper Extremity Assessment: Overall WFL for tasks assessed           Communication Communication Communication: No difficulties   Cognition Arousal/Alertness: Awake/alert Behavior During Therapy:  WFL for tasks assessed/performed   Area of Impairment: Following commands       Following Commands: Follows one step commands consistently       General Comments: Pt needs mod redirection during session to continue with task rather than teaching back task to therapist. Though she is focused on her own safety, it does tend to interfere with task completion.   General Comments       Exercises       Shoulder Instructions      Home Living Family/patient expects to be discharged to:: Private residence Living Arrangements: Alone Available Help at  Discharge: Family;Available PRN/intermittently Type of Home: House Home Access: Stairs to enter Entrance Stairs-Number of Steps: 2   Home Layout: Two level;Able to live on main level with bedroom/bathroom Alternate Level Stairs-Number of Steps: holds onto door and cane        Bathroom Toilet: Standard     Home Equipment: Wheelchair - manual;Walker - standard;Cane - single point;Grab bars - toilet   Additional Comments: pt gave up driving because she could not reach the pedals anymore;  pt's sisters drive her as needed      Prior Functioning/Environment Level of Independence: Independent        Comments: pt states she sponge bathes only.         OT Problem List: Decreased strength;Decreased knowledge of use of DME or AE   OT Treatment/Interventions: Self-care/ADL training;Patient/family education;DME and/or AE instruction;Therapeutic activities    OT Goals(Current goals can be found in the care plan section) Acute Rehab OT Goals Patient Stated Goal: return to her independence. OT Goal Formulation: With patient Time For Goal Achievement: 08/22/16 Potential to Achieve Goals: Good  OT Frequency: Min 2X/week   Barriers to D/C:            Co-evaluation              End of Session Equipment Utilized During Treatment: Rolling walker  Activity Tolerance: Patient tolerated treatment well Patient left: in chair;with call bell/phone within reach;with chair alarm set   Time: 0922-1005 OT Time Calculation (min): 43 min Charges:  OT General Charges $OT Visit: 1 Procedure OT Evaluation $OT Eval Low Complexity: 1 Procedure OT Treatments $Self Care/Home Management : 8-22 mins $Therapeutic Activity: 8-22 mins G-Codes: OT G-codes **NOT FOR INPATIENT CLASS** Functional Assessment Tool Used: clinical judgement Functional Limitation: Self care Self Care Current Status (Z6109): At least 1 percent but less than 20 percent impaired, limited or restricted Self Care Goal  Status (U0454): At least 1 percent but less than 20 percent impaired, limited or restricted  Lennox Laity  [phone] 08/08/2016, 12:33 PM

## 2016-08-08 NOTE — Progress Notes (Signed)
Physical Therapy Treatment Patient Details Name: Brandi [name] MRN: [id number] DOB: [birth year] Today's Date: 08/08/2016    History of Present Illness Brandi [name] is a 83 y.o. female with a medical history of hypertension, anxiety, presented to the emergency department with complaints of back and leg pain. Patient fell on Christmas Eve.    PT Comments    Pt is doing well from mobility standpoint, she is Ox3 and follows commands consistently; she appears to have some unusual cognitive issues--perseverates at times on various objects or  Methods of doing things during PT session; however she is often perseverating on safety so this is a good thing for her; continue to recommend HHPT, it is unclear if pt sister would be able to provide incr supervision for safety, no family present  Follow Up Recommendations  Home health PT     Equipment Recommendations  Rolling walker with 5" wheels (petite ht)    Recommendations for Other Services       Precautions / Restrictions Precautions Precautions: Fall Restrictions Weight Bearing Restrictions: No    Mobility  Bed Mobility               General bed mobility comments:  (pt in chair)  Transfers Overall transfer level: Needs assistance Equipment used: Rolling walker (2 wheeled) Transfers: Sit to/from Stand Sit to Stand: Supervision;Modified independent (Device/Increase time)         General transfer comment: pt self cues  Ambulation/Gait Ambulation/Gait assistance: Supervision Ambulation Distance (Feet): 160 Feet Assistive device: Rolling walker (2 wheeled) Gait Pattern/deviations: Step-through pattern;Decreased stride length     General Gait Details: no LOb, good gait stability, pt self cues throughout distance   Stairs Stairs: Yes   Stair Management: Two rails;Step to pattern;Forwards Number of Stairs: 2 General stair comments: cues for sequence, step-to for safety  Wheelchair Mobility    Modified Rankin  (Stroke Patients Only)       Balance     Sitting balance-Leahy Scale: Good     Standing balance support: No upper extremity supported;During functional activity;Bilateral upper extremity supported Standing balance-Leahy Scale: Fair (to good )                      Cognition Arousal/Alertness: Awake/alert Behavior During Therapy: WFL for tasks assessed/performed   Area of Impairment: Following commands;Problem solving       Following Commands: Follows one step commands consistently       General Comments: pt talks throughout session, she is "teaching" us how to do our work; occasional redirection to continue task and stop "teaching"    Exercises      General Comments        Pertinent Vitals/Pain Pain Assessment: Faces Faces Pain Scale: No hurt Pain Descriptors / Indicators: Sore Pain Intervention(s): Monitored during session    Home Living                      Prior Function            PT Goals (current goals can now be found in the care plan section) Acute Rehab PT Goals Patient Stated Goal: to move around Time For Goal Achievement: 08/11/16 Potential to Achieve Goals: Good Progress towards PT goals: Progressing toward goals    Frequency    Min 4X/week      PT Plan Current plan remains appropriate    Co-evaluation             End of  Session Equipment Utilized During Treatment: Gait belt Activity Tolerance: Patient tolerated treatment well Patient left: with call bell/phone within reach;in chair;with chair alarm set     Time: 1021-1055 PT Time Calculation (min) (ACUTE ONLY): 34 min  Charges:  $Gait Training: 23-37 mins                    G Codes:      Selyna Klahn Aug 11, 2016, 10:55 AM

## 2016-08-09 DIAGNOSIS — R410 Disorientation, unspecified: Secondary | ICD-10-CM

## 2016-08-09 DIAGNOSIS — M79609 Pain in unspecified limb: Secondary | ICD-10-CM

## 2016-08-09 DIAGNOSIS — F411 Generalized anxiety disorder: Secondary | ICD-10-CM

## 2016-08-09 MED ORDER — LORAZEPAM 2 MG/ML IJ SOLN
2.0000 mg | Freq: Once | INTRAMUSCULAR | Status: AC
  Administered 2016-08-09: 1 mg via INTRAVENOUS
  Filled 2016-08-09: qty 1

## 2016-08-09 MED ORDER — DIVALPROEX SODIUM 125 MG PO CSDR
500.0000 mg | DELAYED_RELEASE_CAPSULE | Freq: Every day | ORAL | Status: DC
  Administered 2016-08-10 – 2016-08-14 (×5): 500 mg via ORAL
  Filled 2016-08-09 (×6): qty 4

## 2016-08-09 MED ORDER — HALOPERIDOL LACTATE 5 MG/ML IJ SOLN
5.0000 mg | Freq: Four times a day (QID) | INTRAMUSCULAR | Status: DC | PRN
  Administered 2016-08-09 – 2016-08-10 (×2): 5 mg via INTRAVENOUS
  Filled 2016-08-09 (×2): qty 1

## 2016-08-09 MED ORDER — ALPRAZOLAM 0.5 MG PO TABS
0.5000 mg | ORAL_TABLET | Freq: Once | ORAL | Status: AC
  Administered 2016-08-09: 0.5 mg via ORAL
  Filled 2016-08-09: qty 1

## 2016-08-09 MED ORDER — HALOPERIDOL LACTATE 5 MG/ML IJ SOLN
5.0000 mg | Freq: Once | INTRAMUSCULAR | Status: AC
  Administered 2016-08-09: 5 mg via INTRAVENOUS
  Filled 2016-08-09: qty 1

## 2016-08-09 MED ORDER — QUETIAPINE FUMARATE 25 MG PO TABS: 25.0000 mg | ORAL_TABLET | Freq: Every day | ORAL | Status: DC

## 2016-08-09 MED ORDER — ALPRAZOLAM 0.5 MG PO TABS
0.7500 mg | ORAL_TABLET | Freq: Three times a day (TID) | ORAL | Status: DC
  Administered 2016-08-10 (×2): 0.75 mg via ORAL
  Filled 2016-08-09 (×4): qty 1

## 2016-08-09 MED ORDER — LORAZEPAM 2 MG/ML IJ SOLN
1.0000 mg | Freq: Once | INTRAMUSCULAR | Status: AC
  Administered 2016-08-09: 20:00:00 1 mg via INTRAVENOUS
  Filled 2016-08-09: qty 1

## 2016-08-09 MED ORDER — LORAZEPAM 2 MG/ML IJ SOLN
2.0000 mg | INTRAMUSCULAR | Status: DC | PRN
  Administered 2016-08-09: 2 mg via INTRAVENOUS
  Filled 2016-08-09: qty 1

## 2016-08-09 MED ORDER — QUETIAPINE FUMARATE 25 MG PO TABS
25.0000 mg | ORAL_TABLET | Freq: Every day | ORAL | Status: DC
  Filled 2016-08-09: qty 1

## 2016-08-09 MED ORDER — HALOPERIDOL LACTATE 5 MG/ML IJ SOLN
2.5000 mg | INTRAMUSCULAR | Status: AC | PRN
  Administered 2016-08-09 (×2): 2.5 mg via INTRAVENOUS
  Filled 2016-08-09 (×2): qty 1

## 2016-08-09 MED ORDER — ALPRAZOLAM 0.5 MG PO TABS: 0.5000 mg | ORAL_TABLET | Freq: Three times a day (TID) | ORAL | Status: DC

## 2016-08-09 MED ORDER — HALOPERIDOL LACTATE 5 MG/ML IJ SOLN
INTRAMUSCULAR | Status: AC
  Filled 2016-08-09: qty 1

## 2016-08-09 MED ORDER — DIVALPROEX SODIUM 125 MG PO CSDR
250.0000 mg | DELAYED_RELEASE_CAPSULE | Freq: Every day | ORAL | Status: DC
Start: 2016-08-09 — End: 2016-08-15
  Administered 2016-08-10 – 2016-08-15 (×6): 250 mg via ORAL
  Filled 2016-08-09 (×7): qty 2

## 2016-08-09 MED ORDER — HALOPERIDOL LACTATE 5 MG/ML IJ SOLN
2.0000 mg | Freq: Once | INTRAMUSCULAR | Status: AC
  Administered 2016-08-09: 2 mg via INTRAVENOUS
  Filled 2016-08-09: qty 1

## 2016-08-09 NOTE — Progress Notes (Signed)
PT Cancellation Note  Patient Details Name: Felix PaciniFrances A Shutt MRN: [id number] DOB: 10-Oct-1928   Cancelled Treatment:    Reason Eval/Treat Not Completed: Other (comment) the patient is agitated ambulating with staff.    Rada HayHill, Lorea Kupfer Elizabeth 08/09/2016, 8:42 AM Blanchard KelchKaren Elleanna Melling PT [phone]

## 2016-08-09 NOTE — Progress Notes (Signed)
OT Cancellation Note  Patient Details Name: Brandi PaciniFrances A Phommachanh MRN: [id number] DOB: 06/05/1929   Cancelled Treatment:    Reason Eval/Treat Not Completed: Other (comment).  Pt not ready:  Agitated at this time and having hallucinations  [name] 08/09/2016, 8:22 AM  Marica OtterMaryellen Idaliz Tinkle, OTR/L [phone] 08/09/2016

## 2016-08-09 NOTE — Progress Notes (Signed)
[facility], [facility] and confirmed patient active with nursing, physical therapy, and Child psychotherapistsocial worker. Patient currently very confused, will continue to follow for d/c needs.

## 2016-08-09 NOTE — Progress Notes (Signed)
[facility]  PROGRESS NOTE  Brandi [name] OVF:[id number] DOB: [birth year] DOA: 08/06/2016 PCP: Isabella Stalling, MD   Brief HPI:    83 y.o. female with a medical history of hypertension, anxiety, presented to the emergency department with complaints of back and leg pain. Patient fell on Christmas Eve. She had presented to [facility] and received several x-rays as well as CT of the head, all unremarkable, and patient was discharged home. Patient states that she started having severe lower back pain as well as leg pain more so in the left. Patient states she's been unable to bear weight on her leg and has noticed that his started to bruise. She lives at home alone and states she is unable to take care of herself. Patient does endorse a history of lower back pain with some left-sided weakness however now it is become worse. She has been taking ibuprofen and using Lidoderm patches at home however no relief in her symptoms. She currently denies chest pain, shortness of breath, abdominal pain, nausea, vomiting, diarrhea, constipation, dizziness or headache, recent travel or illness.   Subjective   Patient became increasingly confused and agitated last night. This morning she continues to be confused, agitated, fighting the nursing staff and getting out of bed.   Assessment/Plan:      Steroid induced delirium- patient was started on high-dose Decadron 10 mg every 6 hours for back pain , and now has become increasingly confused and agitated will  start on Haldol 5 mg IV every 6 hours when necessary, Depakote 250 mg every morning, 500 mg daily at bedtime, Seroquel 25 mg daily at bedtime. Decadron has been discontinued  Back/Leg pain with ambulatory dysfunction -Patient had fall on Christmas Eve. Presented to [facility] 2 days ago and was sent home.  -Presented with increased leg pain and cannot walk -MRI lumbar spine L4-L5 large left foraminal disc extrusion, left severe left  foraminal stenosis -Dr Catha Gosselin spoke with neurosurgery, Dr. Jordan Likes, recommended decadron 10mg  q6hours.  Will change Decadron to 4 mg every 6 hours Would like to try conservative management first.  If patient improves, she can follow up with him as an outpatient. -PT and OT consulted (patient does live alone), And recommended home health PT  Essential hypertension -Continue metoprolol, valsartan  Anxiety Will change Xanax to 0.75 mg by mouth 3 times a day   GERD -Continue Zantac    DVT prophylaxis: Lovenox  Code Status: Full code  Family Communication: No family at bedside  Disposition Plan: Pending  Improvement of back pain, Likely home in next 24 hours   Consultants:  None   Procedures:  None  Continuous infusions     Antibiotics:   Anti-infectives    None       Objective   Vitals:   08/08/16 2238 08/09/16 0455 08/09/16 1000 08/09/16 1400  BP: (!) 147/97 (!) 185/62 (!) 133/92 (!) 167/119  Pulse: (!) 54 (!) 50 (!) 58 (!) 107  Resp:  20 20 20   Temp:  97.7 F (36.5 C) 97.9 F (36.6 C) 98.4 F (36.9 C)  TempSrc:  Axillary Axillary Axillary  SpO2: 95% 99% 99% 99%  Weight:      Height:        Intake/Output Summary (Last 24 hours) at 08/09/16 1610 Last data filed at 08/09/16 1400  Gross per 24 hour  Intake              500 ml  Output  350 ml  Net              150 ml   Filed Weights   08/06/16 1754  Weight: 64.4 kg (142 lb)     Physical Examination:  General exam: Appears calm and comfortable. Respiratory system: Clear to auscultation. Respiratory effort normal. Cardiovascular system:  RRR. No  murmurs, rubs, gallops. No pedal edema. GI system: Abdomen is nondistended, soft and nontender. No organomegaly.  Central nervous system. No focal neurological deficits. 5 x 5 power in all extremities. Skin: No rashes, lesions or ulcers. Psychiatry: Alert, oriented x 3.Judgement and insight appear normal. Affect  normal. Musculoskeletal- SLR positive in left leg    Data Reviewed: I have personally reviewed following labs and imaging studies  CBG: No results for input(s): GLUCAP in the last 168 hours.  CBC:  Recent Labs Lab 08/04/16 1942 08/06/16 1243 08/07/16 0501  WBC 6.8 7.0 7.2  NEUTROABS 4.8 5.2  --   HGB 12.1 12.3 12.6  HCT 35.2* 35.2* 38.5  MCV 95.1 92.9 94.6  PLT 202 209 207    Basic Metabolic Panel:  Recent Labs Lab 08/04/16 1942 08/06/16 1243 08/07/16 0501  NA 141 135 141  K 3.2* 3.7 4.5  CL 107 103 108  CO2 26 23 22   GLUCOSE 114* 100* 126*  BUN 12 13 23*  CREATININE 0.88 0.86 0.86  CALCIUM 9.0 9.1 9.3    No results found for this or any previous visit (from the past 240 hour(s)).   Liver Function Tests:  Recent Labs Lab 08/06/16 1243  AST 23  ALT 15  ALKPHOS 51  BILITOT 0.7  PROT 6.5  ALBUMIN 4.2   No results for input(s): LIPASE, AMYLASE in the last 168 hours. No results for input(s): AMMONIA in the last 168 hours.  Cardiac Enzymes:  Recent Labs Lab 08/04/16 1942  TROPONINI <0.03      Studies: No results found.  Scheduled Meds: . ALPRAZolam  0.75 mg Oral TID  . aspirin  81 mg Oral Daily  . B-complex with vitamin C  1 tablet Oral Daily  . cholecalciferol  5,000 Units Oral Daily  . divalproex  250 mg Oral Daily  . divalproex  500 mg Oral QHS  . enoxaparin (LOVENOX) injection  40 mg Subcutaneous Q24H  . irbesartan  75 mg Oral Daily  . lidocaine  1 patch Transdermal Q24H  . metoprolol succinate  100 mg Oral QPM  . multivitamin with minerals  1 tablet Oral Daily  . QUEtiapine  25 mg Oral QHS  . sodium chloride flush  3 mL Intravenous Q12H      Time spent: 25 min  [facility],Tivis Wherry S   [facility] Pager [phone]. If 7PM-7AM, please contact night-coverage at [email or web address], Office  [phone]  password [email or web address] 08/09/2016, 4:10 PM  LOS: 1 day

## 2016-08-09 NOTE — Progress Notes (Signed)
Pt has been up all night talking non stop with the sitters and is now having visual and auditory hallucinations. No other neurological changes noted. Notified Dr. Craige CottaKirby, no new orders at this time, just continue to monitor.

## 2016-08-10 ENCOUNTER — Inpatient Hospital Stay (HOSPITAL_COMMUNITY): Payer: Medicare Other

## 2016-08-10 DIAGNOSIS — Z882 Allergy status to sulfonamides status: Secondary | ICD-10-CM

## 2016-08-10 DIAGNOSIS — Z79891 Long term (current) use of opiate analgesic: Secondary | ICD-10-CM

## 2016-08-10 DIAGNOSIS — Z880 Allergy status to penicillin: Secondary | ICD-10-CM

## 2016-08-10 DIAGNOSIS — Z90710 Acquired absence of both cervix and uterus: Secondary | ICD-10-CM

## 2016-08-10 DIAGNOSIS — Z79899 Other long term (current) drug therapy: Secondary | ICD-10-CM

## 2016-08-10 DIAGNOSIS — Z87891 Personal history of nicotine dependence: Secondary | ICD-10-CM

## 2016-08-10 DIAGNOSIS — Z91010 Allergy to peanuts: Secondary | ICD-10-CM

## 2016-08-10 DIAGNOSIS — Z9049 Acquired absence of other specified parts of digestive tract: Secondary | ICD-10-CM

## 2016-08-10 LAB — COMPREHENSIVE METABOLIC PANEL
ALBUMIN: 4.3 g/dL (ref 3.5–5.0)
ALT: 27 U/L (ref 14–54)
ANION GAP: 9 (ref 5–15)
AST: 44 U/L — ABNORMAL HIGH (ref 15–41)
Alkaline Phosphatase: 54 U/L (ref 38–126)
BUN: 31 mg/dL — ABNORMAL HIGH (ref 6–20)
CHLORIDE: 107 mmol/L (ref 101–111)
CO2: 26 mmol/L (ref 22–32)
Calcium: 9.5 mg/dL (ref 8.9–10.3)
Creatinine, Ser: 0.63 mg/dL (ref 0.44–1.00)
GFR calc non Af Amer: >60 mL/min (ref >60)
GLUCOSE: 106 mg/dL — AB (ref 65–99)
Potassium: 3.9 mmol/L (ref 3.5–5.1)
Sodium: 142 mmol/L (ref 135–145)
Total Bilirubin: 1.3 mg/dL — ABNORMAL HIGH (ref 0.3–1.2)
Total Protein: 6.6 g/dL (ref 6.5–8.1)

## 2016-08-10 MED ORDER — NYSTATIN 100000 UNIT/GM EX CREA: TOPICAL_CREAM | Freq: Three times a day (TID) | CUTANEOUS | Status: DC

## 2016-08-10 MED ORDER — HALOPERIDOL LACTATE 5 MG/ML IJ SOLN
2.5000 mg | Freq: Once | INTRAMUSCULAR | Status: AC
  Administered 2016-08-10: 2.5 mg via INTRAVENOUS
  Filled 2016-08-10: qty 1

## 2016-08-10 MED ORDER — NYSTATIN 100000 UNIT/GM EX CREA
TOPICAL_CREAM | Freq: Two times a day (BID) | CUTANEOUS | Status: DC
  Administered 2016-08-10 – 2016-08-12 (×4): via TOPICAL
  Administered 2016-08-12: 1 via TOPICAL
  Administered 2016-08-13 – 2016-08-14 (×4): via TOPICAL
  Administered 2016-08-15: 1 via TOPICAL
  Filled 2016-08-10: qty 15

## 2016-08-10 MED ORDER — ALPRAZOLAM 0.5 MG PO TABS
0.5000 mg | ORAL_TABLET | Freq: Three times a day (TID) | ORAL | Status: DC
  Administered 2016-08-10 – 2016-08-15 (×14): 0.5 mg via ORAL
  Filled 2016-08-10 (×14): qty 1

## 2016-08-10 MED ORDER — OXYCODONE HCL 5 MG PO TABS
5.0000 mg | ORAL_TABLET | Freq: Four times a day (QID) | ORAL | Status: DC | PRN
  Administered 2016-08-10 – 2016-08-11 (×3): 5 mg via ORAL
  Filled 2016-08-10 (×3): qty 1

## 2016-08-10 MED ORDER — DOXYCYCLINE HYCLATE 100 MG PO TABS
100.0000 mg | ORAL_TABLET | Freq: Two times a day (BID) | ORAL | Status: DC
  Administered 2016-08-10 – 2016-08-15 (×10): 100 mg via ORAL
  Filled 2016-08-10 (×12): qty 1

## 2016-08-10 MED ORDER — RISPERIDONE 0.5 MG PO TBDP
0.5000 mg | ORAL_TABLET | Freq: Three times a day (TID) | ORAL | Status: DC | PRN
  Administered 2016-08-12: 0.5 mg via ORAL
  Filled 2016-08-10 (×2): qty 1

## 2016-08-10 NOTE — Progress Notes (Signed)
Pt woke up combative and agitated, trying to get out of bed in spite of her posey waist belt restraint; NP Craige CottaKirby paged and notified of event and ordered to give the PRN dose of Haldol 5 mg (Q6h) now; will continue to monitor pt. EKG done and informed NP as well.

## 2016-08-10 NOTE — Progress Notes (Signed)
PT Cancellation Note  Patient Details Name: Brandi PaciniFrances A Bissonette MRN: [id number] DOB: [birth year]   Cancelled Treatment:    Reason Eval/Treat Not Completed: Other (comment) (very emotional, not a good time)has been confused and agitated   [facility],[name] 08/10/2016, 2:42 PM

## 2016-08-10 NOTE — Consult Note (Signed)
[facility]   Reason for Consult:  Agitation, delirium Referring Physician:  Parker Patient Identification: Brandi [name] MRN:  [id number] Principal Diagnosis: Acute delirium Diagnosis:   Patient Active Problem List   Diagnosis Date Noted  . Acute delirium [R41.0] 08/09/2016  . Left leg pain [M79.605] 08/06/2016  . Back pain [M54.9] 08/06/2016  . Essential hypertension [I10] 08/06/2016  . GERD (gastroesophageal reflux disease) [K21.9] 08/06/2016  . Anxiety [F41.9] 08/06/2016    Total Time spent with patient: 20 minutes  Subjective:   Brandi [name] is a 83 y.o. female patient admitted with reports of a fall, confusion. Pt seen and chart reviewed. She was about to receive a shot when I walked in the room. I asked the nurse to wait a moment so we could talk. The pt was oriented to self, intermittently to place, but not to time, date, or situation. Pt made brief eye contact but presented as delirious. She was not combative but was afraid of receiving her shot. Pt was able to deny suicidal/homicidal ideation and did not appear to be internally preoccupied. However, due to her acute delirium, the pt's responses were vague and required redirection.   HPI:  Adapted from [facility] HPI: "83 y.o.femalewith a medical history of hypertension, anxiety, presented to the emergency department with complaints of back and leg pain. Patient fell on Christmas Eve. She had presented to [facility] and received several x-rays as well as CT of the head, all unremarkable, and patient was discharged home. Patient states that she started having severe lower back pain as well as leg pain more so in the left. Patient states she's been unable to bear weight on her leg and has noticed that his started to bruise. She lives at home alone and states she is unable to take care of herself. Patient does endorse a history of lower back pain with some left-sided weakness however now it is become worse.  She has been taking ibuprofen and using Lidoderm patches at home however no relief in her symptoms. She currently denies chest pain, shortness of breath, abdominal pain, nausea, vomiting, diarrhea, constipation, dizziness or headache, recent travel or illness."  Today, discussed with nursing staff 08/10/16. Per nursing staff, pt was very unstable yesterday and required multiple haldol injections. However, they report that she is "80% better" today and continues to improve. We will make some recommendations below to continue this improvement and also keep the patient safe given her age and differences in processing of psychiatric medications (renal/hepatic aging factors). See assessment above.   Past Psychiatric History: UTO  Risk to Self: Is patient at risk for suicide?: No Risk to Others:   Prior Inpatient Therapy:   Prior Outpatient Therapy:    Past Medical History:  Past Medical History:  Diagnosis Date  . A-fib ([place])   . Anxiety   . GERD (gastroesophageal reflux disease)   . Hypertension     Past Surgical History:  Procedure Laterality Date  . ABDOMINAL HYSTERECTOMY    . CHOLECYSTECTOMY     Family History: History reviewed. No pertinent family history. Family Psychiatric  History: UTO Social History:  History  Alcohol Use No     History  Drug Use No    Social History   Social History  . Marital status: Widowed    Spouse name: N/A  . Number of children: N/A  . Years of education: N/A   Social History Main Topics  . Smoking status: Former Research scientist (life sciences)  .  Smokeless tobacco: Never Used  . Alcohol use No  . Drug use: No  . Sexual activity: Not Asked   Other Topics Concern  . None   Social History Narrative  . None   Additional Social History:    Allergies:   Allergies  Allergen Reactions  . Other     Unable to take any kind of generic drug due to intolerance. causes her hiatal hernia to be very over active.  . Peanut-Containing Drug Products Swelling  . Sulfa  Antibiotics Hives    Dizziness   . Penicillins Swelling, Rash and Other (See Comments)    Has patient had a PCN reaction causing immediate rash, facial/tongue/throat swelling, SOB or lightheadedness with hypotension: Yes Has patient had a PCN reaction causing severe rash involving mucus membranes or skin necrosis: No Has patient had a PCN reaction that required hospitalization No Has patient had a PCN reaction occurring within the last 10 years:  If all of the above answers are "NO", then may proceed with Cephalosporin use.    Labs:  Results for orders placed or performed during the hospital encounter of 08/06/16 (from the past 48 hour(s))  Urinalysis, Routine w reflex microscopic     Status: None   Collection Time: 08/08/16  6:00 PM  Result Value Ref Range   Color, Urine YELLOW YELLOW   APPearance CLEAR CLEAR   Specific Gravity, Urine 1.008 1.005 - 1.030   pH 5.0 5.0 - 8.0   Glucose, UA NEGATIVE NEGATIVE mg/dL   Hgb urine dipstick NEGATIVE NEGATIVE   Bilirubin Urine NEGATIVE NEGATIVE   Ketones, ur NEGATIVE NEGATIVE mg/dL   Protein, ur NEGATIVE NEGATIVE mg/dL   Nitrite NEGATIVE NEGATIVE   Leukocytes, UA NEGATIVE NEGATIVE  Comprehensive metabolic panel     Status: Abnormal   Collection Time: 08/10/16  6:51 AM  Result Value Ref Range   Sodium 142 135 - 145 mmol/L   Potassium 3.9 3.5 - 5.1 mmol/L   Chloride 107 101 - 111 mmol/L   CO2 26 22 - 32 mmol/L   Glucose, Bld 106 (H) 65 - 99 mg/dL   BUN 31 (H) 6 - 20 mg/dL   Creatinine, Ser 0.63 0.44 - 1.00 mg/dL   Calcium 9.5 8.9 - 10.3 mg/dL   Total Protein 6.6 6.5 - 8.1 g/dL   Albumin 4.3 3.5 - 5.0 g/dL   AST 44 (H) 15 - 41 U/L   ALT 27 14 - 54 U/L   Alkaline Phosphatase 54 38 - 126 U/L   Total Bilirubin 1.3 (H) 0.3 - 1.2 mg/dL   GFR calc non Af Amer >60 >60 mL/min   GFR calc Af Amer >60 >60 mL/min    Comment: (NOTE) The eGFR has been calculated using the CKD EPI equation. This calculation has not been validated in all clinical  situations. eGFR's persistently <60 mL/min signify possible Chronic Kidney Disease.    Anion gap 9 5 - 15    Current Facility-Administered Medications  Medication Dose Route Frequency Provider Last Rate Last Dose  . 0.9 %  sodium chloride infusion  250 mL Intravenous PRN Brandi Mikhail, DO      . acetaminophen (TYLENOL) tablet 650 mg  650 mg Oral Q6H PRN Brandi Mikhail, DO   650 mg at 08/10/16 1307   Or  . acetaminophen (TYLENOL) suppository 650 mg  650 mg Rectal Q6H PRN Brandi Mikhail, DO      . ALPRAZolam Duanne Moron) tablet 0.75 mg  0.75 mg Oral TID Oswald Hillock,  MD   0.75 mg at 08/10/16 1246  . aspirin chewable tablet 81 mg  81 mg Oral Daily Brandi Mikhail, DO   81 mg at 08/10/16 1246  . B-complex with vitamin C tablet 1 tablet  1 tablet Oral Daily Brandi Mikhail, DO   1 tablet at 08/10/16 1246  . cholecalciferol (VITAMIN D) tablet 5,000 Units  5,000 Units Oral Daily Brandi Mikhail, DO   5,000 Units at 08/10/16 1307  . divalproex (DEPAKOTE SPRINKLE) capsule 250 mg  250 mg Oral Daily Oswald Hillock, MD   250 mg at 08/10/16 1247  . divalproex (DEPAKOTE SPRINKLE) capsule 500 mg  500 mg Oral QHS Oswald Hillock, MD      . enoxaparin (LOVENOX) injection 40 mg  40 mg Subcutaneous Q24H Brandi Mikhail, DO   40 mg at 08/08/16 1857  . haloperidol lactate (HALDOL) injection 5 mg  5 mg Intravenous Q6H PRN Oswald Hillock, MD   5 mg at 08/10/16 0219  . hydrALAZINE (APRESOLINE) tablet 25 mg  25 mg Oral Q6H PRN Oswald Hillock, MD   25 mg at 08/09/16 0502  . irbesartan (AVAPRO) tablet 75 mg  75 mg Oral Daily Brandi Mikhail, DO   75 mg at 08/10/16 1247  . lidocaine (LIDODERM) 5 % 1 patch  1 patch Transdermal Q24H Brandi Mikhail, DO   1 patch at 08/10/16 1246  . metoprolol succinate (TOPROL-XL) 24 hr tablet 100 mg  100 mg Oral QPM Brandi Mikhail, DO   100 mg at 08/08/16 1857  . multivitamin with minerals tablet 1 tablet  1 tablet Oral Daily Brandi Mikhail, DO   1 tablet at 08/10/16 1247  . oxyCODONE (Oxy  IR/ROXICODONE) immediate release tablet 5 mg  5 mg Oral Q6H PRN Oswald Hillock, MD      . QUEtiapine (SEROQUEL) tablet 25 mg  25 mg Oral QHS Gardiner Barefoot, NP      . sodium chloride flush (NS) 0.9 % injection 3 mL  3 mL Intravenous Q12H Brandi Mikhail, DO   3 mL at 08/09/16 2200  . sodium chloride flush (NS) 0.9 % injection 3 mL  3 mL Intravenous PRN Cristal Ford, DO        Musculoskeletal: Strength & Muscle Tone: decreased Gait & Station: unable to stand Patient leans: N/A  Psychiatric Specialty Exam: Physical Exam  Review of Systems  Psychiatric/Behavioral: Positive for depression. Negative for hallucinations, substance abuse and suicidal ideas. The patient is nervous/anxious and has insomnia.   All other systems reviewed and are negative.   Blood pressure (!) 160/71, pulse (!) 110, temperature 97.5 F (36.4 C), temperature source Oral, resp. rate 18, height '4\' 10"'$  (1.473 m), weight 64.4 kg (142 lb), SpO2 100 %.Body mass index is 29.68 kg/m.  General Appearance: Disheveled  Eye Contact:  Fair  Speech:  Clear and Coherent and Normal Rate  Volume:  Decreased  Mood:  Anxious  Affect:  Appropriate and Congruent  Thought Process:  Descriptions of Associations: Loose  Orientation:  Self, intermittently to place, not to situation or time, delirious yet improving  Thought Content:  Focused on pain management  Suicidal Thoughts:  No  Homicidal Thoughts:  No  Memory:  Immediate;   Poor Recent;   Poor Remote;   Poor  Judgement:  Impaired  Insight:  Lacking  Psychomotor Activity:  Increased  Concentration:  Concentration: Poor and Attention Span: Poor  Recall:  Poor  Fund of Knowledge:  Fair  Language:  Fair  Akathisia:  No  Handed:    AIMS (if indicated):     Assets:  Resilience  ADL's:  Impaired  Cognition:  WNL  Sleep:      Treatment Plan Summary: Acute delirium unstable yet improving, treated as below:  Medications: -Discontinue Haldol -Discontinue  Seroquel -Lower her benzo usage from 0.'75mg'$  tid to 0.'5mg'$  tid (was '2mg'$  daily at home) -Add risperidone 0.'5mg'$  M-tabs q8h prn agitation -Use narcotics sparingly  -Pt has greatly improved per staff report and will likely continue to improve as organic conditions are treated. Please let psychiatry know if her condition deteriorates.   Disposition: Cleared by psychiatry; delirium; let us know if we are needed in the future.  Benjamine Mola, FNP 08/10/2016 2:09 PM

## 2016-08-10 NOTE — Progress Notes (Signed)
[facility]  PROGRESS NOTE  Brandi [name] ZOX:[id number] DOB: 15-Aug-1928 DOA: 08/06/2016 PCP: Isabella Stalling, MD   Brief HPI:    83 y.o. female with a medical history of hypertension, anxiety, presented to the emergency department with complaints of back and leg pain. Patient fell on Christmas Eve. She had presented to [facility] and received several x-rays as well as CT of the head, all unremarkable, and patient was discharged home. Patient states that she started having severe lower back pain as well as leg pain more so in the left. Patient states she's been unable to bear weight on her leg and has noticed that his started to bruise. She lives at home alone and states she is unable to take care of herself. Patient does endorse a history of lower back pain with some left-sided weakness however now it is become worse. She has been taking ibuprofen and using Lidoderm patches at home however no relief in her symptoms. She currently denies chest pain, shortness of breath, abdominal pain, nausea, vomiting, diarrhea, constipation, dizziness or headache, recent travel or illness. Patient became increasingly confused and agitated, requiring Haldol, Ativan.  Subjective   Patient Seen and examined, alert, pleasant confused. Better than yesterday   Assessment/Plan:      Steroid induced delirium- patient was started on high-dose Decadron 10 mg every 6 hours for back pain , and now has become increasingly confused and agitated will  started on Haldol 5 mg IV every 6 hours when necessary, Depakote 250 mg every morning, 500 mg daily at bedtime, Seroquel 25 mg daily at bedtime. Decadron has been discontinued. [facility] consult psychiatry for medication adjustment.  Back/Leg pain with ambulatory dysfunction -Patient had fall on Christmas Eve. Presented to [facility] 2 days ago and was sent home.  -Presented with increased leg pain and cannot walk -MRI lumbar spine L4-L5 large left foraminal  disc extrusion, left severe left foraminal stenosis -Dr Catha Gosselin spoke with neurosurgery, Dr. Jordan Likes, recommended decadron 10mg  q6hours.  Will change Decadron to 4 mg every 6 hours Would like to try conservative management first.  If patient improves, she can follow up with him as an outpatient. -PT and OT consulted (patient does live alone), And recommended home health PT  Essential hypertension -Continue metoprolol, valsartan  Anxiety Continue Xanax to 0.75 mg by mouth 3 times a day   GERD -Continue Zantac    DVT prophylaxis: Lovenox  Code Status: Full code  Family Communication: Discussed with patient's sister, patient has been having hallucinations for past few months.  Disposition Plan: Pending  Improvement in mental status, patient might need to go to skilled nursing facility.   Consultants:  None   Procedures:  None  Continuous infusions     Antibiotics:   Anti-infectives    None       Objective   Vitals:   08/09/16 1400 08/10/16 0200 08/10/16 0510 08/10/16 1320  BP: (!) 167/119 (!) 189/158 (!) 136/111 (!) 160/71  Pulse: (!) 107 (!) 122 (!) 124 (!) 110  Resp: 20 20  18   Temp: 98.4 F (36.9 C) 99.2 F (37.3 C) 99.1 F (37.3 C) 97.5 F (36.4 C)  TempSrc: Axillary Axillary Axillary Oral  SpO2: 99% 100% 100% 100%  Weight:      Height:        Intake/Output Summary (Last 24 hours) at 08/10/16 1611 Last data filed at 08/10/16 0930  Gross per 24 hour  Intake  0 ml  Output                1 ml  Net               -1 ml   Filed Weights   08/06/16 1754  Weight: 64.4 kg (142 lb)     Physical Examination:  General exam: Appears calm and comfortable. Respiratory system: Clear to auscultation. Respiratory effort normal. Cardiovascular system:  RRR. No  murmurs, rubs, gallops. No pedal edema. GI system: Abdomen is nondistended, soft and nontender. No organomegaly.  Central nervous system. No focal neurological deficits. 5 x 5  power in all extremities. Skin: No rashes, lesions or ulcers. Psychiatry: Alert, oriented x 3.Judgement and insight appear normal. Affect normal. Musculoskeletal- SLR positive in left leg    Data Reviewed: I have personally reviewed following labs and imaging studies  CBG: No results for input(s): GLUCAP in the last 168 hours.  CBC:  Recent Labs Lab 08/04/16 1942 08/06/16 1243 08/07/16 0501  WBC 6.8 7.0 7.2  NEUTROABS 4.8 5.2  --   HGB 12.1 12.3 12.6  HCT 35.2* 35.2* 38.5  MCV 95.1 92.9 94.6  PLT 202 209 207    Basic Metabolic Panel:  Recent Labs Lab 08/04/16 1942 08/06/16 1243 08/07/16 0501 08/10/16 0651  NA 141 135 141 142  K 3.2* 3.7 4.5 3.9  CL 107 103 108 107  CO2 26 23 22 26   GLUCOSE 114* 100* 126* 106*  BUN 12 13 23* 31*  CREATININE 0.88 0.86 0.86 0.63  CALCIUM 9.0 9.1 9.3 9.5    No results found for this or any previous visit (from the past 240 hour(s)).   Liver Function Tests:  Recent Labs Lab 08/06/16 1243 08/10/16 0651  AST 23 44*  ALT 15 27  ALKPHOS 51 54  BILITOT 0.7 1.3*  PROT 6.5 6.6  ALBUMIN 4.2 4.3   No results for input(s): LIPASE, AMYLASE in the last 168 hours. No results for input(s): AMMONIA in the last 168 hours.  Cardiac Enzymes:  Recent Labs Lab 08/04/16 1942  TROPONINI <0.03      Studies: Ct Head Wo Contrast  Result Date: 08/10/2016 CLINICAL DATA:  Altered mental status. EXAM: CT HEAD WITHOUT CONTRAST TECHNIQUE: Contiguous axial images were obtained from the base of the skull through the vertex without intravenous contrast. COMPARISON:  CT scan of August 04, 2016. FINDINGS: Brain: Mild chronic ischemic white matter disease is noted. No mass effect or midline shift is noted. Ventricular size is within normal limits. There is no evidence of mass lesion, hemorrhage or acute infarction. Vascular: No hyperdense vessel or unexpected calcification. Skull: Normal. Negative for fracture or focal lesion. Sinuses/Orbits: No  acute finding. Other: None. IMPRESSION: Mild chronic ischemic white matter disease. No acute intracranial abnormality seen. Electronically Signed   By: Lupita Raider, M.D.   On: 08/10/2016 12:26    Scheduled Meds: . ALPRAZolam  0.75 mg Oral TID  . aspirin  81 mg Oral Daily  . B-complex with vitamin C  1 tablet Oral Daily  . cholecalciferol  5,000 Units Oral Daily  . divalproex  250 mg Oral Daily  . divalproex  500 mg Oral QHS  . enoxaparin (LOVENOX) injection  40 mg Subcutaneous Q24H  . irbesartan  75 mg Oral Daily  . lidocaine  1 patch Transdermal Q24H  . metoprolol succinate  100 mg Oral QPM  . multivitamin with minerals  1 tablet Oral Daily  . QUEtiapine  25  mg Oral QHS  . sodium chloride flush  3 mL Intravenous Q12H      Time spent: 25 min  [facility] Ltd[facility],Dula Havlik S   [facility] Pager [phone]. If 7PM-7AM, please contact night-coverage at [email or web address], Office  [phone]  password [email or web address] 08/10/2016, 4:11 PM  LOS: 2 days

## 2016-08-10 NOTE — Progress Notes (Signed)
Pt woke up in aggression and combativeness again; still on posey belt restraint; NP on call paged and notified of current condition and ordered 2.5 mg IV Haldol one dose only; will continue to monitor.

## 2016-08-11 LAB — COMPREHENSIVE METABOLIC PANEL
ALK PHOS: 53 U/L (ref 38–126)
ALT: 28 U/L (ref 14–54)
AST: 36 U/L (ref 15–41)
Albumin: 4.1 g/dL (ref 3.5–5.0)
Anion gap: 8 (ref 5–15)
BUN: 22 mg/dL — ABNORMAL HIGH (ref 6–20)
CALCIUM: 9.3 mg/dL (ref 8.9–10.3)
CHLORIDE: 99 mmol/L — AB (ref 101–111)
CO2: 28 mmol/L (ref 22–32)
Creatinine, Ser: 0.66 mg/dL (ref 0.44–1.00)
GFR calc non Af Amer: >60 mL/min (ref >60)
Glucose, Bld: 145 mg/dL — ABNORMAL HIGH (ref 65–99)
Potassium: 3.3 mmol/L — ABNORMAL LOW (ref 3.5–5.1)
SODIUM: 135 mmol/L (ref 135–145)
Total Bilirubin: 1.3 mg/dL — ABNORMAL HIGH (ref 0.3–1.2)
Total Protein: 6.5 g/dL (ref 6.5–8.1)

## 2016-08-11 MED ORDER — LIP MEDEX EX OINT
TOPICAL_OINTMENT | CUTANEOUS | Status: AC
  Administered 2016-08-11: 18:00:00
  Filled 2016-08-11: qty 7

## 2016-08-11 MED ORDER — TRAMADOL HCL 50 MG PO TABS
50.0000 mg | ORAL_TABLET | Freq: Two times a day (BID) | ORAL | Status: DC | PRN
  Administered 2016-08-11 – 2016-08-12 (×2): 50 mg via ORAL
  Filled 2016-08-11 (×2): qty 1

## 2016-08-11 MED ORDER — ALUM & MAG HYDROXIDE-SIMETH 200-200-20 MG/5ML PO SUSP
30.0000 mL | ORAL | Status: DC | PRN
  Administered 2016-08-11 – 2016-08-14 (×2): 30 mL via ORAL
  Filled 2016-08-11 (×4): qty 30

## 2016-08-11 MED ORDER — IBUPROFEN 400 MG PO TABS
400.0000 mg | ORAL_TABLET | Freq: Four times a day (QID) | ORAL | Status: DC
  Administered 2016-08-11 – 2016-08-15 (×16): 400 mg via ORAL
  Filled 2016-08-11 (×15): qty 1

## 2016-08-11 NOTE — Evaluation (Signed)
Occupational Therapy Re-Evaluation Patient Details Name: Brandi PaciniFrances A [name] MRN: [id number] DOB: 1928/08/26 Today's Date: 08/11/2016    History of Present Illness Brandi [name] is a 83 y.o. female with a medical history of hypertension, anxiety, presented to the emergency department with complaints of back and leg pain. Patient fell on Christmas Eve. MRI lumbar spine L4-L5 large left foraminal disc extrusion, left severe left foraminal stenosis.  During course of recovery, pt became confused and agitated:    Clinical Impression   Pt was reassessed today by OT due to change in mental status/behavior (confusion and agitation). She needs much more assistance than she did on previous evaluation dated 08/08/16.  Now recommend STSNF for rehab prior to home.  Will follow in acute with supervision to min A level goals    Follow Up Recommendations  SNF    Equipment Recommendations  3 in 1 bedside commode (if she doesn't have this)    Recommendations for Other Services       Precautions / Restrictions Precautions Precautions: Fall Restrictions Weight Bearing Restrictions: No      Mobility Bed Mobility Overal bed mobility: Needs Assistance Bed Mobility: Supine to Sit     Supine to sit: Mod assist     General bed mobility comments: assis with LES and assist with scoot hips to Edge of bed and assist with upper body as well.   Transfers Overall transfer level: Needs assistance Equipment used: Rolling walker (2 wheeled) Transfers: Sit to/from Stand Sit to Stand: Min assist         General transfer comment: She pulled on walker and stated she "could not do this" however was able to perform the transfer , but seemed very fearful, "I can't do this".     Balance Overall balance assessment: Needs assistance;History of Falls Sitting-balance support: Bilateral upper extremity supported Sitting balance-Leahy Scale: Fair Sitting balance - Comments: was very dizzy sitting edge of bed, BP  were WNL, however listed to the right with sitting even after a few minutes, stating her feet and bottom felt like they were dnacing all over the place.  Postural control: Right lateral lean                                  ADL       Grooming: Maximal assistance;Sitting (hair; set up hands; min A lip balm)   Upper Body Bathing: Moderate assistance;Sitting   Lower Body Bathing: Maximal assistance;Sit to/from stand   Upper Body Dressing : Minimal assistance;Sitting   Lower Body Dressing: Maximal assistance;Sit to/from stand   Toilet Transfer: Minimal assistance;Ambulation;BSC;RW   Toileting- Clothing Manipulation and Hygiene: Total assistance;Sit to/from stand         General ADL Comments: pt needed encouragement to perform adls. Did not feel she could perform hygiene after using commode, stated:  "I might fall if I let go of walker".  guided R hand to apply lip balm as she stated 'I'm afraid I'll get it in my nose".       Vision     Perception     Praxis      Pertinent Vitals/Pain Pain Assessment: Faces Faces Pain Scale: Hurts little more Pain Location: Both legs if even gently touched to assist. "it hurts a lot" "don't touch my legs I am scared of wha tI might do and I don't want to hurt anyone".  Pain Descriptors / Indicators: Aching;Sore Pain Intervention(s):  Limited activity within patient's tolerance;Monitored during session;Repositioned     Hand Dominance     Extremity/Trunk Assessment Upper Extremity Assessment Upper Extremity Assessment: Generalized weakness (only able to lift to 90 degrees; bruises bil UEs)          Communication Communication Communication: No difficulties   Cognition Arousal/Alertness: Awake/alert Behavior During Therapy: WFL for tasks assessed/performed Overall Cognitive Status: Impaired/Different from baseline Area of Impairment: Following commands       Following Commands: Follows one step commands with  increased time       General Comments: cues for safety; extra time to follow commands.  Pt conversing, calm and recalls falling from bed prior to admission. Not oriented to place.  She asked "how did you get into my home"   General Comments       Exercises       Shoulder Instructions      Home Living Family/patient expects to be discharged to:: Unsure Living Arrangements: Alone Available Help at Discharge: Family;Available PRN/intermittently Type of Home: House Home Access: Stairs to enter Entrance Stairs-Number of Steps: 2   Home Layout: Two level;Able to live on main level with bedroom/bathroom Alternate Level Stairs-Number of Steps: holds onto door and cane        Bathroom Toilet: Standard     Home Equipment: Wheelchair - manual;Walker - standard;Cane - single point;Grab bars - toilet   Additional Comments: pt gave up driving because she could not reach the pedals anymore;  pt's sisters drive her as needed      Prior Functioning/Environment Level of Independence: Independent        Comments: pt states she sponge bathes only.         OT Problem List: Decreased strength;Decreased activity tolerance;Impaired balance (sitting and/or standing);Decreased cognition;Decreased safety awareness;Decreased knowledge of use of DME or AE;Pain   OT Treatment/Interventions: Self-care/ADL training;Patient/family education;DME and/or AE instruction;Therapeutic activities;Cognitive remediation/compensation    OT Goals(Current goals can be found in the care plan section) Acute Rehab OT Goals Patient Stated Goal: none stated today, but willing to get up and move with therapy OT Goal Formulation: With patient Time For Goal Achievement: 08/18/16 Potential to Achieve Goals: Good ADL Goals Pt Will Perform Grooming: (P) with supervision;standing Pt Will Perform Lower Body Dressing: (P) with min assist;with adaptive equipment;sit to/from stand Pt Will Transfer to Toilet: (P) with min  guard assist;ambulating;bedside commode Pt Will Perform Toileting - Clothing Manipulation and hygiene: (P) with min guard assist;sit to/from stand Additional ADL Goal #1: (P) pt will be oriented to place and time using environmental cues  OT Frequency: Min 2X/week   Barriers to D/C:            Co-evaluation PT/OT/SLP Co-Evaluation/Treatment: Yes Reason for Co-Treatment: For patient/therapist safety PT goals addressed during session: Mobility/safety with mobility OT goals addressed during session: ADL's and self-care      End of Session    Activity Tolerance: Patient tolerated treatment well Patient left: in chair;with call bell/phone within reach;with chair alarm set;with nursing/sitter in room   Time: 1128-1202 OT Time Calculation (min): 34 min Charges:  OT General Charges $OT Visit: 1 Procedure OT Evaluation $OT Re-eval: 1 Procedure G-Codes:    Elijah Phommachanh 31-Aug-2016, 1:07 PM Marica Otter, OTR/L [phone] 08/31/16

## 2016-08-11 NOTE — Evaluation (Signed)
Physical Therapy RE- Evaluation Patient Details Name: Brandi [name] MRN: [id number] DOB: June 03, 1929 Today's Date: 08/11/2016   History of Present Illness  Brandi [name] is a 83 y.o. female with a medical history of hypertension, anxiety, presented to the emergency department with complaints of back and leg pain. Patient fell on Christmas Eve. MRI lumbar spine L4-L5 large left foraminal disc extrusion, left severe left foraminal stenosis.  During course of recovery, pt became confused and agitated  Clinical Impression  According to pt's sitter and chart, pt has been crying out and not oriented past days and last evening. Sitter stated she finally went to sleep late this morning. After waking for this session, the sitter observed her to be the most coherent and cognitively intact that she had seen. She was following most single commands, and responding appropriately to questions. Not oriented to self, location, or reason for hospitalization, and fearful of movement, "I might fall, .. I can't do that". Today tolerated walking with 2 person assist with rW for safety in room about 7 feet x2. At this level feel she will need SNF, to continue therapy to assess her progression and mobility.     Follow Up Recommendations SNF    Equipment Recommendations  Rolling walker with 5" wheels (pt states he may have RW, however unsure of accuracy of this information )    Recommendations for Other Services       Precautions / Restrictions Precautions Precautions: Fall Restrictions Weight Bearing Restrictions: No      Mobility  Bed Mobility Overal bed mobility: Needs Assistance Bed Mobility: Supine to Sit     Supine to sit: Mod assist     General bed mobility comments: assis twith LES and assist with scoot hips to Edge of bed and assist with upper body as well.   Transfers Overall transfer level: Needs assistance Equipment used: Rolling walker (2 wheeled) Transfers: Sit to/from Stand Sit to  Stand: Min assist         General transfer comment: She pulled on walker and stated she "could not do this" however was able to perform the transfer , but seemed very fearful, "I can't do this".   Ambulation/Gait Ambulation/Gait assistance: Min assist Ambulation Distance (Feet): 7 Feet Assistive device: Rolling walker (2 wheeled) Gait Pattern/deviations: Step-through pattern Gait velocity: slow    General Gait Details: very small steps, some trembling possibly due to weakness, flexed posture in trunk and B LEs, encouraged these few steps to make it to a distance bed side commade, then 7 feet back tot eh recliner to sit.   Stairs            Wheelchair Mobility    Modified Rankin (Stroke Patients Only)       Balance Overall balance assessment: Needs assistance;History of Falls Sitting-balance support: Bilateral upper extremity supported Sitting balance-Leahy Scale: Fair Sitting balance - Comments: was very dizzy sitting edge of bed, BP were WNL, however listed to the right with sitting even after a few minutes, stating her feet and bottom felt like they were dnacing all over the place.  Postural control: Right lateral lean                                   Pertinent Vitals/Pain Pain Assessment: Faces Faces Pain Scale: Hurts little more Pain Location: Both legs if even gently touched to assist. "it hurts a lot" "don't touch my legs  I am scared of wha tI might do and I don't want to hurt anyone".  Pain Descriptors / Indicators: Aching;Sore Pain Intervention(s): Limited activity within patient's tolerance;Monitored during session;Repositioned    Home Living Family/patient expects to be discharged to:: Unsure Living Arrangements: Alone Available Help at Discharge: Family;Available PRN/intermittently Type of Home: House Home Access: Stairs to enter   Entrance Stairs-Number of Steps: 2 Home Layout: Two level;Able to live on main level with  bedroom/bathroom Home Equipment: Wheelchair - manual;Walker - standard;Cane - single point;Grab bars - toilet Additional Comments: pt gave up driving because she could not reach the pedals anymore;  pt's sisters drive her as needed    Prior Function Level of Independence: Independent         Comments: pt states she sponge bathes only.      Hand Dominance        Extremity/Trunk Assessment   Upper Extremity Assessment Upper Extremity Assessment: Generalized weakness (only able to lift to 90 degrees; bruises bil UEs)    Lower Extremity Assessment Lower Extremity Assessment: Generalized weakness (very guarded with movement of LEs and UEs, slow and did not want us to touch much to assist due to soreness in LEs. Marland Kitchen. ) LLE Deficits / Details: grossly 3/5, AROM WFL       Communication   Communication: No difficulties  Cognition Arousal/Alertness: Awake/alert Behavior During Therapy: WFL for tasks assessed/performed Overall Cognitive Status: Impaired/Different from baseline Area of Impairment: Following commands       Following Commands: Follows one step commands with increased time       General Comments: cues for safety; extra time to follow commands.  Pt conversing, calm and recalls falling from bed prior to admission. Not oriented to place.  She asked "how did you get into my home"    General Comments      Exercises     Assessment/Plan    PT Assessment Patient needs continued PT services  PT Problem List Decreased strength;Decreased activity tolerance;Decreased balance;Decreased mobility;Decreased knowledge of use of DME;Decreased safety awareness          PT Treatment Interventions DME instruction;Gait training;Functional mobility training;Therapeutic activities;Therapeutic exercise;Balance training;Patient/family education    PT Goals (Current goals can be found in the Care Plan section)  Acute Rehab PT Goals Patient Stated Goal: none stated today, but willing  to get up and move with therapy PT Goal Formulation: Patient unable to participate in goal setting Time For Goal Achievement: 08/25/16 Potential to Achieve Goals: Good (if pateint continues to clear cognitively)    Frequency Min 3X/week   Barriers to discharge        Co-evaluation   Reason for Co-Treatment: For patient/therapist safety PT goals addressed during session: Mobility/safety with mobility OT goals addressed during session: ADL's and self-care       End of Session Equipment Utilized During Treatment: Gait belt Activity Tolerance: Patient tolerated treatment well Patient left: with chair alarm set;with nursing/sitter in room;with call bell/phone within reach Nurse Communication: Mobility status         Time: 1130-1200 PT Time Calculation (min) (ACUTE ONLY): 30 min   Charges:   PT Evaluation $PT Re-evaluation: 1 Procedure     PT G CodesMarella Bile:        Karah Caruthers 08/11/2016, 1:07 PM  Marella BileSharron Jalaine Riggenbach, PT Pager: [phone] 08/11/2016

## 2016-08-11 NOTE — Progress Notes (Signed)
PROGRESS NOTE  Brandi PaciniFrances A Anastasi ZOX:[id number] DOB: 1929/07/26 DOA: 08/06/2016 PCP: Isabella StallingNDIEGO,RICHARD M, MD  Brief Narrative: 83 year old woman presented with leg and back pain after fall. Imaging revealed L4-L5 large left foraminal disc extrusion and severe left foraminal stenosis. Case was discussed with neurosurgery who recommended IV steroids. However the patient could not tolerate IV steroids and became quite confused. Therefore psychiatry was consulted for further recommendations and steroids were discontinued. At this point neurologic exam is benign but she continues to have significant pain. Eventually skilled nursing facility as planned.  Assessment/Plan 1. Acute delirium. Improving per sitter at bedside. Per Psych--risperidone, avoid narcotics and lower benzodiazepine.  2. Back and left leg pain secondary to left foraminal disc extrusion as well as severe left foraminal stenosis. Admitting physician discussed with neurosurgery who recommended IV steroids, however the patient could not tolerate this secondary to confusion.   She remains confused and seems to be improving. DC narcotics for now. Minimal benzodiazepine use. Continue risperidone as per psychiatry.  Continue physical therapy. She'll need skilled facility placement. Per staff earlier today she got up and walked well. The confusion is significantly impairing examination and perception of pain.  DVT prophylaxis: L:ovenox Code Status: full code Family Communication: none Disposition Plan: SNF  Brendia Sacksaniel Goodrich, MD  [facility] Direct contact: [phone] --Via amion app OR  --[email or web address]; password [email or web address]  7PM-7AM contact night coverage as above 08/11/2016, 4:33 PM  LOS: 3 days   Consultants:    Procedures:    Antimicrobials:    Interval history/Subjective: Per sitter was confused this AM, but then improved throughout the day, pain seemed fairly well controlled and patient was able to sit up and stand up  to bedside commode several times. Later in the day started c/o pain again.  Patient c/o pain all over, especially legs.  Objective: Vitals:   08/10/16 1751 08/11/16 0639 08/11/16 0912 08/11/16 1432  BP: (!) 162/76 (!) 152/71 (!) 144/69 123/67  Pulse: (!) 107 98 72 (!) 51  Resp: 20 16  12   Temp:  99.7 F (37.6 C)  99.2 F (37.3 C)  TempSrc:  Axillary  Oral  SpO2: 100% 98%  99%  Weight:      Height:        Intake/Output Summary (Last 24 hours) at 08/11/16 1633 Last data filed at 08/11/16 1615  Gross per 24 hour  Intake             1200 ml  Output              925 ml  Net              275 ml     Filed Weights   08/06/16 1754  Weight: 64.4 kg (142 lb)    Exam:    Constitutional:   Appears uncomfortable, nontoxic, anxious. Respiratory:   CTA bilaterally, no w/r/r. Normal resp effort. Cardiovascular:   RRR, no m/r/g. No LE edema. Musculoskeletal:   Moves all extremities. Moves both legs to command.  Strength LLE 5/5, sensation intact  RLE strength appears grossly normal Psychiatric:   Alert, oriented to self, hospital, not month or year   I have personally reviewed following labs and imaging studies:  Basic metabolic panel unremarkable. LFTs unremarkable.  CT head no acute disease.  Scheduled Meds: . ALPRAZolam  0.5 mg Oral TID  . B-complex with vitamin C  1 tablet Oral Daily  . cholecalciferol  5,000 Units Oral Daily  . divalproex  250 mg Oral Daily  .  divalproex  500 mg Oral QHS  . doxycycline  100 mg Oral Q12H  . enoxaparin (LOVENOX) injection  40 mg Subcutaneous Q24H  . ibuprofen  400 mg Oral QID  . irbesartan  75 mg Oral Daily  . lidocaine  1 patch Transdermal Q24H  . metoprolol succinate  100 mg Oral QPM  . multivitamin with minerals  1 tablet Oral Daily  . nystatin cream   Topical BID  . sodium chloride flush  3 mL Intravenous Q12H   Continuous Infusions:  Principal Problem:   Acute delirium Active Problems:   Left leg pain   Back  pain   Essential hypertension   GERD (gastroesophageal reflux disease)   Anxiety   LOS: 3 days

## 2016-08-12 DIAGNOSIS — I10 Essential (primary) hypertension: Secondary | ICD-10-CM

## 2016-08-12 DIAGNOSIS — R410 Disorientation, unspecified: Secondary | ICD-10-CM

## 2016-08-12 DIAGNOSIS — M549 Dorsalgia, unspecified: Secondary | ICD-10-CM

## 2016-08-12 MED ORDER — ONDANSETRON HCL 4 MG/2ML IJ SOLN
4.0000 mg | Freq: Four times a day (QID) | INTRAMUSCULAR | Status: DC | PRN
  Administered 2016-08-12: 09:00:00 4 mg via INTRAVENOUS
  Filled 2016-08-12: qty 2

## 2016-08-12 MED ORDER — OXYCODONE-ACETAMINOPHEN 5-325 MG PO TABS
1.0000 | ORAL_TABLET | ORAL | Status: DC | PRN
  Administered 2016-08-12: 1 via ORAL
  Filled 2016-08-12: qty 1

## 2016-08-12 NOTE — Care Management Important Message (Signed)
Important Message  Patient Details  Name: Brandi PaciniFrances A [name] MRN: [id number] Date of Birth: Jun 23, 1929   Medicare Important Message Given:  Yes    Caren MacadamFuller, Harald Quevedo 08/12/2016, 10:53 AMImportant Message  Patient Details  Name: Brandi PaciniFrances A [name] MRN: [id number] Date of Birth: Jun 23, 1929   Medicare Important Message Given:  Yes    Caren MacadamFuller, Muskan Bolla 08/12/2016, 10:53 AM

## 2016-08-12 NOTE — Progress Notes (Signed)
Occupational Therapy Treatment Patient Details Name: Brandi [name] MRN: [id number] DOB: 03-01-1929 Today's Date: 08/12/2016    History of present illness Brandi [name] is a 83 y.o. female with a medical history of hypertension, anxiety, presented to the emergency department with complaints of back and leg pain. Patient fell on Christmas Eve. MRI lumbar spine L4-L5 large left foraminal disc extrusion, left severe left foraminal stenosis.  During course of recovery, pt became confused and agitated   OT comments  Pt calm, following commands better, but c/o increased pain this session  Follow Up Recommendations  SNF    Equipment Recommendations  3 in 1 bedside commode    Recommendations for Other Services      Precautions / Restrictions Precautions Precautions: Fall Restrictions Weight Bearing Restrictions: No       Mobility Bed Mobility         Supine to sit: Mod assist;+2 for safety/equipment Sit to supine: Mod assist;+2 for physical assistance   General bed mobility comments: assist for trunk and bil LEs  Transfers   Equipment used: Rolling walker (2 wheeled)   Sit to Stand: Min assist Stand pivot transfers: Min assist       General transfer comment: cues for safety; stabilized walker as pt tended to pull up on it    Balance     Sitting balance-Leahy Scale: Fair                             ADL                           Toilet Transfer: Minimal assistance;Stand-pivot;BSC;RW   Toileting- Clothing Manipulation and Hygiene: Total assistance;Sit to/from stand         General ADL Comments: pt agreeable to trying to sit on commode.  Pain increased; premedicated.  Sitter assisted with bed mobility.  C/O dizziness when pt got back to EOB from 3:1 commode.  Unable to describe lightheadedness vs spinning      Vision                     Perception     Praxis      Cognition   Behavior During Therapy: WFL for tasks  assessed/performed                    General Comments: improved following of commands today    Extremity/Trunk Assessment               Exercises     Shoulder Instructions       General Comments      Pertinent Vitals/ Pain       Pain Assessment: Faces Faces Pain Scale: Hurts even more Pain Location: L hip to knee and buttocks Pain Intervention(s): Limited activity within patient's tolerance;Monitored during session;Premedicated before session;Repositioned  Home Living                                          Prior Functioning/Environment              Frequency  Min 2X/week        Progress Toward Goals  OT Goals(current goals can now be found in the care plan section)     Acute Rehab OT Goals Time For Goal  Achievement: 08/18/16  Plan      Co-evaluation                 End of Session     Activity Tolerance Patient tolerated treatment well   Patient Left in bed;with call bell/phone within reach;with nursing/sitter in room   Nurse Communication          Time: 1610-9604 OT Time Calculation (min): 16 min  Charges: OT General Charges $OT Visit: 1 Procedure OT Treatments $Self Care/Home Management : 8-22 mins  Kayman Snuffer 08/12/2016, 3:19 PM  Marica Otter, OTR/L [phone] 08/12/2016

## 2016-08-12 NOTE — Progress Notes (Signed)
Patient reports losing her rings while in the hospital. Spoke with sister, Shirlee LimerickMarion, who reports she has the patients, three rings, one is a wedding ring and two pink rings. Sister also states that she has purse and "cross".

## 2016-08-12 NOTE — Progress Notes (Signed)
PROGRESS NOTE    Brandi [name]  ZOX:[id number] DOB: Apr 07, 1929 DOA: 08/06/2016 PCP: Isabella Stalling, MD   Brief Narrative:  83 year old woman presented with leg and back pain after fall. Imaging revealed L4-L5 large left foraminal disc extrusion and severe left foraminal stenosis. Neurosurgery recommended IV steroids. Patient developed suspected toxic encephalopathy with delirium. Therefore psychiatry was consulted and steroids were discontinued.  On 1:1 sitter, off restrains, continue pain control, will need snf.   Assessment & Plan:   Principal Problem:   Acute delirium Active Problems:   Left leg pain   Back pain   Essential hypertension   GERD (gastroesophageal reflux disease)   Anxiety   1. Toxic encephalopathy. Patient off restrains, continue 1:1 sitter. Neuro checks per unit protocol, continue pain control and physical therapy. Will continue depakote, alprazolam and risperidone.   2.  Acute on chronic back pain due to foraminal stenosis. Will continue pain control and physical therapy, will need placement on SNF, when improves from encephalopathy, not needing sitter. Will plan to d,c sitter in am. Will add percocet for severe pain.   3. HTN. Will continue blood pressure control with irbesartan, metoprolol, hydralazine.   4. Nausea. Clinically improved, continue as needed zofran. Tolerating po well.     DVT prophylaxis: enoxaparin  Code Status: full  Family Communication: No family at the bedside  Disposition Plan: SNF  Consultants:     Procedures:   Antimicrobials:    Subjective: Patient had chest pain and nausea this am, persistent pain back pain, not completely relieved by current analgesic regimen. No dyspnea, no diarrhea. Sitter at the bedside. No agitation. Most information from nursing due to patient's confusion.   Objective: Vitals:   08/11/16 2232 08/12/16 0535 08/12/16 0830 08/12/16 1143  BP: (!) 146/42 (!) 192/70 (!) 173/57 (!) 142/63  Pulse:  61 65 64 67  Resp: 12 14  18   Temp:  98.9 F (37.2 C)    TempSrc:  Oral    SpO2: 98% 97% 96% 96%  Weight:      Height:        Intake/Output Summary (Last 24 hours) at 08/12/16 1328 Last data filed at 08/12/16 1006  Gross per 24 hour  Intake             1500 ml  Output             1875 ml  Net             -375 ml   Filed Weights   08/06/16 1754  Weight: 64.4 kg (142 lb)    Examination:  General exam: deconditioned, not in pain or dyspnea E ENT: no pallor or icterus, oral mucosa moist Respiratory system: Clear to auscultation. Respiratory effort normal. Mild decreased breath sounds at bases due to poor ins Cardiovascular system: S1 & S2 heard, RRR. No JVD, murmurs, rubs, gallops or clicks. No pedal edema. Gastrointestinal system: Abdomen is nondistended, soft and nontender. No organomegaly or masses felt. Normal bowel sounds heard. Central nervous system: Alert. Disorientated. No focal neurological deficits. Extremities: Symmetric 5 x 5 power. Skin: No rashes, lesions or ulcers  Data Reviewed: I have personally reviewed following labs and imaging studies  CBC:  Recent Labs Lab 08/06/16 1243 08/07/16 0501  WBC 7.0 7.2  NEUTROABS 5.2  --   HGB 12.3 12.6  HCT 35.2* 38.5  MCV 92.9 94.6  PLT 209 207   Basic Metabolic Panel:  Recent Labs Lab 08/06/16 1243 08/07/16 0501 08/10/16 0651 08/11/16  0801  NA 135 141 142 135  K 3.7 4.5 3.9 3.3*  CL 103 108 107 99*  CO2 23 22 26 28   GLUCOSE 100* 126* 106* 145*  BUN 13 23* 31* 22*  CREATININE 0.86 0.86 0.63 0.66  CALCIUM 9.1 9.3 9.5 9.3   GFR: Estimated Creatinine Clearance: 40.8 mL/min (by C-G formula based on SCr of 0.66 mg/dL). Liver Function Tests:  Recent Labs Lab 08/06/16 1243 08/10/16 0651 08/11/16 0801  AST 23 44* 36  ALT 15 27 28   ALKPHOS 51 54 53  BILITOT 0.7 1.3* 1.3*  PROT 6.5 6.6 6.5  ALBUMIN 4.2 4.3 4.1   No results for input(s): LIPASE, AMYLASE in the last 168 hours. No results for  input(s): AMMONIA in the last 168 hours. Coagulation Profile: No results for input(s): INR, PROTIME in the last 168 hours. Cardiac Enzymes: No results for input(s): CKTOTAL, CKMB, CKMBINDEX, TROPONINI in the last 168 hours. BNP (last 3 results) No results for input(s): PROBNP in the last 8760 hours. HbA1C: No results for input(s): HGBA1C in the last 72 hours. CBG: No results for input(s): GLUCAP in the last 168 hours. Lipid Profile: No results for input(s): CHOL, HDL, LDLCALC, TRIG, CHOLHDL, LDLDIRECT in the last 72 hours. Thyroid Function Tests: No results for input(s): TSH, T4TOTAL, FREET4, T3FREE, THYROIDAB in the last 72 hours. Anemia Panel: No results for input(s): VITAMINB12, FOLATE, FERRITIN, TIBC, IRON, RETICCTPCT in the last 72 hours. Sepsis Labs: No results for input(s): PROCALCITON, LATICACIDVEN in the last 168 hours.  No results found for this or any previous visit (from the past 240 hour(s)).       Radiology Studies: No results found.      Scheduled Meds: . ALPRAZolam  0.5 mg Oral TID  . B-complex with vitamin C  1 tablet Oral Daily  . cholecalciferol  5,000 Units Oral Daily  . divalproex  250 mg Oral Daily  . divalproex  500 mg Oral QHS  . doxycycline  100 mg Oral Q12H  . enoxaparin (LOVENOX) injection  40 mg Subcutaneous Q24H  . ibuprofen  400 mg Oral QID  . irbesartan  75 mg Oral Daily  . lidocaine  1 patch Transdermal Q24H  . metoprolol succinate  100 mg Oral QPM  . multivitamin with minerals  1 tablet Oral Daily  . nystatin cream   Topical BID  . sodium chloride flush  3 mL Intravenous Q12H   Continuous Infusions:   LOS: 4 days        Mauricio Annett Gulaaniel Arrien, MD [facility] Pager [phone]  If 7PM-7AM, please contact night-coverage [email or web address] Password [email or web address] 08/12/2016, 1:28 PM

## 2016-08-13 DIAGNOSIS — R262 Difficulty in walking, not elsewhere classified: Secondary | ICD-10-CM

## 2016-08-13 LAB — CREATININE, SERUM
CREATININE: 0.65 mg/dL (ref 0.44–1.00)
GFR calc Af Amer: >60 mL/min (ref >60)

## 2016-08-13 NOTE — NC FL2 (Signed)
[place] MEDICAID FL2 LEVEL OF CARE SCREENING TOOL     IDENTIFICATION  Patient Name: Brandi [name] Birthdate: 1929/03/20 Sex: female Admission Date (Current Location): 08/06/2016  [facility] and [place] Number:  Producer, television/film/video and Address:  [facility],  [address], [place] [address]      Provider Number: [id number]  Attending Physician Name and Address:  Coralie Keens, *  Relative Name and Phone Number:       Current Level of Care: Hospital Recommended Level of Care: Skilled Nursing Facility Prior Approval Number:    Date Approved/Denied:   PASRR Number:    Discharge Plan: SNF    Current Diagnoses: Patient Active Problem List   Diagnosis Date Noted  . Acute delirium 08/09/2016  . Left leg pain 08/06/2016  . Back pain 08/06/2016  . Essential hypertension 08/06/2016  . GERD (gastroesophageal reflux disease) 08/06/2016  . Anxiety 08/06/2016    Orientation RESPIRATION BLADDER Height & Weight     Self, Time, Situation, Place  Normal Continent Weight: 142 lb (64.4 kg) Height:  4\' 10"  (147.3 cm)  BEHAVIORAL SYMPTOMS/MOOD NEUROLOGICAL BOWEL NUTRITION STATUS      Continent Diet (Heart Healthy)  AMBULATORY STATUS COMMUNICATION OF NEEDS Skin   Extensive Assist Does not communicate Skin abrasions, Other (Comment) (Eccyhmosis, moistrue assoc skin damange to bilateral ankle and elbows, legs and arms bilaterally, skin tear to left hand)                       Personal Care Assistance Level of Assistance  Bathing, Dressing Bathing Assistance: Limited assistance   Dressing Assistance: Limited assistance     Functional Limitations Info             SPECIAL CARE FACTORS FREQUENCY  PT (By licensed PT), OT (By licensed OT)     PT Frequency: 5 OT Frequency: 5            Contractures Contractures Info: Not present    Additional Factors Info  Code Status, Allergies Code Status Info: Full code Allergies Info:  Peanut-containing drug productx, sulfa antibiotics, PCNS           Current Medications (08/13/2016):  This is the current hospital active medication list Current Facility-Administered Medications  Medication Dose Route Frequency Provider Last Rate Last Dose  . 0.9 %  sodium chloride infusion  250 mL Intravenous PRN Maryann Mikhail, DO      . acetaminophen (TYLENOL) tablet 650 mg  650 mg Oral Q6H PRN Maryann Mikhail, DO   650 mg at 08/12/16 0720   Or  . acetaminophen (TYLENOL) suppository 650 mg  650 mg Rectal Q6H PRN Maryann Mikhail, DO      . ALPRAZolam Prudy Feeler) tablet 0.5 mg  0.5 mg Oral TID Beau Fanny, FNP   0.5 mg at 08/13/16 1509  . alum & mag hydroxide-simeth (MAALOX/MYLANTA) 200-200-20 MG/5ML suspension 30 mL  30 mL Oral PRN Standley Brooking, MD   30 mL at 08/11/16 2101  . B-complex with vitamin C tablet 1 tablet  1 tablet Oral Daily Maryann Mikhail, DO   1 tablet at 08/13/16 0926  . cholecalciferol (VITAMIN D) tablet 5,000 Units  5,000 Units Oral Daily Maryann Mikhail, DO   5,000 Units at 08/13/16 [phone]  . divalproex (DEPAKOTE SPRINKLE) capsule 250 mg  250 mg Oral Daily Meredeth Ide, MD   250 mg at 08/13/16 1478  . divalproex (DEPAKOTE SPRINKLE) capsule 500 mg  500 mg  Oral QHS Meredeth IdeGagan S Lama, MD   500 mg at 08/12/16 2244  . doxycycline (VIBRA-TABS) tablet 100 mg  100 mg Oral Q12H Meredeth IdeGagan S Lama, MD   100 mg at 08/13/16 2021  . enoxaparin (LOVENOX) injection 40 mg  40 mg Subcutaneous Q24H Maryann Mikhail, DO   40 mg at 08/13/16 1735  . hydrALAZINE (APRESOLINE) tablet 25 mg  25 mg Oral Q6H PRN Meredeth IdeGagan S Lama, MD   25 mg at 08/13/16 [id number]  . ibuprofen (ADVIL,MOTRIN) tablet 400 mg  400 mg Oral QID Standley Brookinganiel P Goodrich, MD   400 mg at 08/13/16 1735  . irbesartan (AVAPRO) tablet 75 mg  75 mg Oral Daily Maryann Mikhail, DO   75 mg at 08/13/16 0926  . lidocaine (LIDODERM) 5 % 1 patch  1 patch Transdermal Q24H Maryann Mikhail, DO   1 patch at 08/13/16 0930  . metoprolol succinate (TOPROL-XL) 24 hr  tablet 100 mg  100 mg Oral QPM Maryann Mikhail, DO   100 mg at 08/13/16 1735  . multivitamin with minerals tablet 1 tablet  1 tablet Oral Daily Maryann Mikhail, DO   1 tablet at 08/13/16 [id number]  . nystatin cream (MYCOSTATIN)   Topical BID Meredeth IdeGagan S Lama, MD      . ondansetron [facility](ZOFRAN) injection 4 mg  4 mg Intravenous Q6H PRN Coralie KeensMauricio Daniel Arrien, MD   4 mg at 08/12/16 0856  . oxyCODONE-acetaminophen (PERCOCET/ROXICET) 5-325 MG per tablet 1 tablet  1 tablet Oral Q4H PRN Coralie KeensMauricio Daniel Arrien, MD   1 tablet at 08/12/16 1352  . risperiDONE (RISPERDAL M-TABS) disintegrating tablet 0.5 mg  0.5 mg Oral Q8H PRN Beau FannyJohn C Withrow, FNP   0.5 mg at 08/12/16 [id number]  . sodium chloride flush (NS) 0.9 % injection 3 mL  3 mL Intravenous Q12H Maryann Mikhail, DO   3 mL at 08/13/16 0937  . sodium chloride flush (NS) 0.9 % injection 3 mL  3 mL Intravenous PRN Maryann Mikhail, DO      . traMADol (ULTRAM) tablet 50 mg  50 mg Oral Q12H PRN Standley Brookinganiel P Goodrich, MD   50 mg at 08/12/16 0541     Discharge Medications: Please see discharge summary for a list of discharge medications.  Relevant Imaging Results:  Relevant Lab Results:   Additional Information SSN:  [id number]  Steroid induced delirium- cleared by psychiatrist. no sitter or restraints now.  weakness  short term SNF  Darylene PriceCrowder, Jarod Bozzo T, [place]

## 2016-08-13 NOTE — Progress Notes (Signed)
PROGRESS NOTE    Felix PaciniFrances A Blass  VWU:[id number] DOB: October 04, 1928 DOA: 08/06/2016 PCP: Isabella StallingNDIEGO,RICHARD M, MD    Brief Narrative:  83 year old woman presented with leg and back pain after fall. Imaging revealed L4-L5 large left foraminal disc extrusion and severe left foraminal stenosis. Neurosurgery recommended IV steroids. Patient developed suspected toxic encephalopathy with delirium. Therefore psychiatry was consulted and steroids were discontinued.  On 1:1 sitter, off restrains, continue pain control, will need snf.  Assessment & Plan:   Principal Problem:   Acute delirium Active Problems:   Left leg pain   Back pain   Essential hypertension   GERD (gastroesophageal reflux disease)   Anxiety    1. Toxic encephalopathy. Patient off restrains and 1:1 sitter. Neuro checks per unit protocol, continue pain control and physical therapy. Continue depakote, alprazolam and risperidone. Patient was able to ambulate 160 feet with rolling walker. Will arrange for snf.   2.  Acute on chronic back pain due to foraminal stenosis. Continue pain control and physical therapy. Pain controlled with acetaminophen, tramadol and oxycodone.   3. HTN. Blood pressure control with irbesartan, metoprolol, hydralazine.   4. Nausea. Clinically improved, continue as needed zofran. Tolerating po well.     DVT prophylaxis: enoxaparin  Code Status: full  Family Communication: No family at the bedside  Disposition Plan: SNF  Consultants:     Procedures:   Antimicrobials:    Subjective: Patient feeling much better, no nausea or vomiting, no further agitation or confusion. Tolerating well po and physical therapy.   Objective: Vitals:   08/12/16 2210 08/13/16 0400 08/13/16 0628 08/13/16 1400  BP: (!) 179/60 (!) 144/73 (!) 175/75 (!) 146/63  Pulse: 71 64 66 73  Resp: 16  16 15   Temp: 99.6 F (37.6 C)  99.1 F (37.3 C) 99.6 F (37.6 C)  TempSrc: Oral  Oral Oral  SpO2: 94%  98% 98%    Weight:      Height:        Intake/Output Summary (Last 24 hours) at 08/13/16 1504 Last data filed at 08/13/16 1359  Gross per 24 hour  Intake              840 ml  Output              825 ml  Net               15 ml   Filed Weights   08/06/16 1754  Weight: 64.4 kg (142 lb)    Examination:  General exam: Not in pain or dyspnea   E ENT: mild pallor, oral mucosa moist.  Respiratory system: Clear to auscultation. Respiratory effort normal. No wheezing, rales or rhonchi. Cardiovascular system: S1 & S2 heard, RRR. No JVD, murmurs, rubs, gallops or clicks. No pedal edema. Gastrointestinal system: Abdomen is nondistended, soft and nontender. No organomegaly or masses felt. Normal bowel sounds heard. Central nervous system: Alert and oriented. No focal neurological deficits. Extremities: Symmetric 5 x 5 power. Skin: No rashes, lesions or ulcers  Data Reviewed: I have personally reviewed following labs and imaging studies  CBC:  Recent Labs Lab 08/07/16 0501  WBC 7.2  HGB 12.6  HCT 38.5  MCV 94.6  PLT 207   Basic Metabolic Panel:  Recent Labs Lab 08/07/16 0501 08/10/16 0651 08/11/16 0801 08/13/16 0454  NA 141 142 135  --   K 4.5 3.9 3.3*  --   CL 108 107 99*  --   CO2 22 26 28   --  GLUCOSE 126* 106* 145*  --   BUN 23* 31* 22*  --   CREATININE 0.86 0.63 0.66 0.65  CALCIUM 9.3 9.5 9.3  --    GFR: Estimated Creatinine Clearance: 40.8 mL/min (by C-G formula based on SCr of 0.65 mg/dL). Liver Function Tests:  Recent Labs Lab 08/10/16 0651 08/11/16 0801  AST 44* 36  ALT 27 28  ALKPHOS 54 53  BILITOT 1.3* 1.3*  PROT 6.6 6.5  ALBUMIN 4.3 4.1   No results for input(s): LIPASE, AMYLASE in the last 168 hours. No results for input(s): AMMONIA in the last 168 hours. Coagulation Profile: No results for input(s): INR, PROTIME in the last 168 hours. Cardiac Enzymes: No results for input(s): CKTOTAL, CKMB, CKMBINDEX, TROPONINI in the last 168 hours. BNP (last 3  results) No results for input(s): PROBNP in the last 8760 hours. HbA1C: No results for input(s): HGBA1C in the last 72 hours. CBG: No results for input(s): GLUCAP in the last 168 hours. Lipid Profile: No results for input(s): CHOL, HDL, LDLCALC, TRIG, CHOLHDL, LDLDIRECT in the last 72 hours. Thyroid Function Tests: No results for input(s): TSH, T4TOTAL, FREET4, T3FREE, THYROIDAB in the last 72 hours. Anemia Panel: No results for input(s): VITAMINB12, FOLATE, FERRITIN, TIBC, IRON, RETICCTPCT in the last 72 hours. Sepsis Labs: No results for input(s): PROCALCITON, LATICACIDVEN in the last 168 hours.  No results found for this or any previous visit (from the past 240 hour(s)).       Radiology Studies: No results found.      Scheduled Meds: . ALPRAZolam  0.5 mg Oral TID  . B-complex with vitamin C  1 tablet Oral Daily  . cholecalciferol  5,000 Units Oral Daily  . divalproex  250 mg Oral Daily  . divalproex  500 mg Oral QHS  . doxycycline  100 mg Oral Q12H  . enoxaparin (LOVENOX) injection  40 mg Subcutaneous Q24H  . ibuprofen  400 mg Oral QID  . irbesartan  75 mg Oral Daily  . lidocaine  1 patch Transdermal Q24H  . metoprolol succinate  100 mg Oral QPM  . multivitamin with minerals  1 tablet Oral Daily  . nystatin cream   Topical BID  . sodium chloride flush  3 mL Intravenous Q12H   Continuous Infusions:   LOS: 5 days        Mauricio Annett Gula, MD [facility] Pager [phone]  If 7PM-7AM, please contact night-coverage [email or web address] Password [email or web address] 08/13/2016, 3:04 PM

## 2016-08-13 NOTE — Progress Notes (Signed)
Physical Therapy Treatment Patient Details Name: Brandi [name] MRN: [id number] DOB: [birth year] Today's Date: 08/13/2016    History of Present Illness Brandi [name] is a 83 y.o. female with a medical history of hypertension, anxiety, presented to the emergency department with complaints of back and leg pain. Patient fell on Christmas Eve. MRI lumbar spine L4-L5 large left foraminal disc extrusion, left severe left foraminal stenosis.  During course of recovery, pt became confused and agitated    PT Comments    Pt is progressing well; she is calm, cooperative  and following commands without difficulty today  Follow Up Recommendations  SNF     Equipment Recommendations  Rolling walker with 5" wheels    Recommendations for Other Services       Precautions / Restrictions Precautions Precautions: Fall Restrictions Weight Bearing Restrictions: No    Mobility  Bed Mobility Overal bed mobility: Needs Assistance Bed Mobility: Supine to Sit     Supine to sit: Min guard;Supervision Sit to supine: Min guard   General bed mobility comments: light assist for LEs onto bed, slightly incr time  Transfers Overall transfer level: Needs assistance Equipment used: Rolling walker (2 wheeled) Transfers: Sit to/from Stand Sit to Stand: Min guard;Supervision         General transfer comment: cues for safety adn hand placement; stabilized walker as pt tended to pull up on it, pt able to correct and push from bed with one hand  Ambulation/Gait Ambulation/Gait assistance: Min assist;Min guard Ambulation Distance (Feet): 160 Feet Assistive device: Rolling walker (2 wheeled) Gait Pattern/deviations: Step-through pattern;Decreased stride length     General Gait Details: intermittent assist to maneuver RW, cues for safety   Stairs            Wheelchair Mobility    Modified Rankin (Stroke Patients Only)       Balance     Sitting balance-Leahy Scale: Good     Standing  balance support: No upper extremity supported Standing balance-Leahy Scale: Fair+, pt dancing in hallway while using RW                      Cognition Arousal/Alertness: Awake/alert Behavior During Therapy: [facility]WFL for tasks assessed/performed           Following Commands: Follows one step commands consistently       General Comments: pt is oriented at this time, she follows one step functional commands without delay    Exercises      General Comments        Pertinent Vitals/Pain Pain Assessment: Faces Faces Pain Scale: No hurt    Home Living                      Prior Function            PT Goals (current goals can now be found in the care plan section) Acute Rehab PT Goals Patient Stated Goal: none stated today, but willing to get up and move with therapy Time For Goal Achievement: 08/25/16 Potential to Achieve Goals: Good Progress towards PT goals: Progressing toward goals    Frequency    Min 3X/week      PT Plan Current plan remains appropriate    Co-evaluation             End of Session Equipment Utilized During Treatment: Gait belt Activity Tolerance: Patient tolerated treatment well Patient left: in bed;with call bell/phone within reach;Other (comment) (bed rails x4  per pt request)     Time: 1037-1050 PT Time Calculation (min) (ACUTE ONLY): 13 min  Charges:  $Gait Training: 8-22 mins                    G Codes:      Brandi [name] 01-Sep-2016, 11:49 AM

## 2016-08-13 NOTE — Clinical Social Work Note (Signed)
Patient referred for SNF placement;  SW unable to engage sister today to discuss d/c plan as original plan was for d/c home with home health prior to development of delirium which has now improved. 1:1 sitter and restraints have been removed and patient ambulated 160' per MD.   [facility] CaoFl2 has been initiated and PASARR submitted to [facility] for PASARR number.  MD & nursing Please note:  Pt's Pasarr referral is under manual review. SW CANNOT place patient until PASARR number is obtained.   SW services to follow up with patient and sister tomorrow morning to determine if plan is to proceed with SNF placement.  If so- referral will be sent to SNFs- [place]Lupita Leash.  Layth Cerezo T. Jaci LazierCrowder, KentuckyLCSW [phone] (weekend coverage)

## 2016-08-14 ENCOUNTER — Inpatient Hospital Stay (HOSPITAL_COMMUNITY): Payer: Medicare Other

## 2016-08-14 DIAGNOSIS — R609 Edema, unspecified: Secondary | ICD-10-CM

## 2016-08-14 MED ORDER — NYSTATIN 100000 UNIT/ML MT SUSP
5.0000 mL | Freq: Four times a day (QID) | OROMUCOSAL | Status: DC
  Administered 2016-08-14 – 2016-08-15 (×2): 500000 [IU] via ORAL
  Filled 2016-08-14 (×2): qty 5

## 2016-08-14 NOTE — Clinical Social Work Note (Signed)
Clinical Social Work Assessment  Patient Details  Name: Brandi [name] MRN: [id number] Date of Birth: 07-24-1929  Date of referral:  08/14/16               Reason for consult:  Facility Placement                Permission sought to share information with:  Oceanographer granted to share information::  Yes, Verbal Permission Granted  Name::     Freight forwarder::  snf  Relationship::  sisters  Contact Information:     Housing/Transportation Living arrangements for the past 2 months:  Single Family Home Source of Information:  Patient, Other (Comment Required) (sister) Patient Interpreter Needed:  None Criminal Activity/Legal Involvement Pertinent to Current Situation/Hospitalization:  No - Comment as needed Significant Relationships:  Siblings Lives with:  Self Do you feel safe going back to the place where you live?  No Need for family participation in patient care:  Yes (Comment) (some help with decision making from sisters)  Care giving concerns: Pt lives at home alone- states she has help from her two sisters but spoke with sister Pieter Partridge who states pt is not able to properly care for herself at home and that herself and other sister Verdon Cummins May are not physically capable of helping patient at home.   Pt lives alone but sister Pieter Partridge reportedly pays all bills and puts medicines in containers which pt does not take appropriately, Pieter Partridge also reports pt hallucinates and expresses paranoid thoughts that people are stealing from her.  Pt dtr lives in [place] and two elderly sisters are only family nearby.   Social Worker assessment / plan:  CSW spoke with pt concerning PT recommendation for SNF- initially refusing SNF and states she can go home with help from sisters who pt reports would do anything for her.  Spoke with pt sister Pieter Partridge who states this is not a safe plan Pieter Partridge is weak and other sisters husband just had back surgery).    CSW revisited idea of SNF  with pt who continued to be resistant. Throughout conversation patient very repetitive consistently forgetting conversation that occurred within the same minute.  By end of conversation pt was agreeable to SNF if she had to but prefers to go home with sister's help (despite being told at least 5-6 times that sisters unable to help at this time).  Pt is fearful of falling at home after recent incident and states she would like 24 hour help in case she needed it.  Employment status:    Insurance information:  Medicare PT Recommendations:    Information / Referral to community resources:     Patient/Family's Response to care:  Pt hesitant to SNF (thinks that means she is incapable of caring for herself) but agreeable to looking into options in [place].  Patient/Family's Understanding of and Emotional Response to Diagnosis, Current Treatment, and Prognosis:  Unclear- pt very repetitive during conversation and do no think she is capable of retaining medical information at this time.  Emotional Assessment Appearance:    Attitude/Demeanor/Rapport:  Apprehensive Affect (typically observed):  Pleasant Orientation:  Oriented to Self, Oriented to Place, Oriented to Situation, Oriented to  Time Alcohol / Substance use:  Not Applicable Psych involvement (Current and /or in the community):  Yes (Comment) (evaled)  Discharge Needs  Concerns to be addressed:  Cognitive Concerns, Care Coordination Readmission within the last 30 days:  No Current discharge risk:  Physical Impairment, Lives  alone Barriers to Discharge:  Continued Medical Work up   Burna SisUris, Huma Imhoff H, LCSW 08/14/2016, 11:23 AM

## 2016-08-14 NOTE — Progress Notes (Signed)
PROGRESS NOTE    Brandi [name]  ZOX:[id number] DOB: 31-Oct-1928 DOA: 08/06/2016 PCP: Isabella Stalling, MD    Brief Narrative:  83 year old woman presented with leg and back pain after fall. Imaging revealed L4-L5 large left foraminal disc extrusion and severe left foraminal stenosis. Neurosurgery recommended IV steroids. Patient developed suspected toxic encephalopathy with delirium. Therefore psychiatry was consulted and steroids were discontinued. On 1:1 sitter, off restrains, continue pain control, will need snf   Assessment & Plan:   Principal Problem:   Acute delirium Active Problems:   Left leg pain   Back pain   Essential hypertension   GERD (gastroesophageal reflux disease)   Anxiety    1. Toxic encephalopathy. Patient off restrains and 1:1 sitter. Noted more confused than yesterday, no agitation. Mentation improved when family at the bedside. Neuro checks per unit protocol, continue pain control and physical therapy. Continue depakote, alprazolam and risperidone. Pending placement at SNF.   2. Acute on chronic back pain due to foraminal stenosis. Continue pain control and physical therapy.  Conitnue acetaminophen, tramadol and oxycodone.   3. HTN. Blood pressure control with irbesartan, metoprolol, hydralazine. Systolic blood pressure 150's.     DVT prophylaxis:enoxaparin  Code Status:full  Family Communication:No family at the bedside  Disposition Plan:SNF  Consultants:    Procedures:  Antimicrobials:  Subjective: Patient feeling well, no chest pain or dyspnea. Mild confusion and forgetfulness. No nausea or vomiting, and tolerating po well.  Objective: Vitals:   08/13/16 1400 08/13/16 2136 08/14/16 0445 08/14/16 1152  BP: (!) 146/63 (!) 155/65 (!) 188/60 (!) 147/66  Pulse: 73 62 60 62  Resp: 15 14 16 16   Temp: 99.6 F (37.6 C) 98.7 F (37.1 C) 97.7 F (36.5 C) 97.8 F (36.6 C)  TempSrc: Oral Oral Oral Oral  SpO2: 98% 95% 95%  99%  Weight:      Height:        Intake/Output Summary (Last 24 hours) at 08/14/16 1323 Last data filed at 08/14/16 1100  Gross per 24 hour  Intake              600 ml  Output             1400 ml  Net             -800 ml   Filed Weights   08/06/16 1754  Weight: 64.4 kg (142 lb)    Examination:  General exam: Not in pain or dyspnea, positive confusion today. E ENT: mild pallor, oral mucosa moist.  Respiratory system: Clear to auscultation. Respiratory effort normal. Cardiovascular system: S1 & S2 heard, RRR. No JVD, murmurs, rubs, gallops or clicks. No pedal edema. Gastrointestinal system: Abdomen is nondistended, soft and nontender. No organomegaly or masses felt. Normal bowel sounds heard. Central nervous system: Alert and oriented. No focal neurological deficits. Extremities: Symmetric 5 x 5 power. Skin: No rashes, lesions or ulcers  Data Reviewed: I have personally reviewed following labs and imaging studies  CBC: No results for input(s): WBC, NEUTROABS, HGB, HCT, MCV, PLT in the last 168 hours. Basic Metabolic Panel:  Recent Labs Lab 08/10/16 0651 08/11/16 0801 08/13/16 0454  NA 142 135  --   K 3.9 3.3*  --   CL 107 99*  --   CO2 26 28  --   GLUCOSE 106* 145*  --   BUN 31* 22*  --   CREATININE 0.63 0.66 0.65  CALCIUM 9.5 9.3  --    GFR: Estimated Creatinine Clearance:  40.8 mL/min (by C-G formula based on SCr of 0.65 mg/dL). Liver Function Tests:  Recent Labs Lab 08/10/16 0651 08/11/16 0801  AST 44* 36  ALT 27 28  ALKPHOS 54 53  BILITOT 1.3* 1.3*  PROT 6.6 6.5  ALBUMIN 4.3 4.1   No results for input(s): LIPASE, AMYLASE in the last 168 hours. No results for input(s): AMMONIA in the last 168 hours. Coagulation Profile: No results for input(s): INR, PROTIME in the last 168 hours. Cardiac Enzymes: No results for input(s): CKTOTAL, CKMB, CKMBINDEX, TROPONINI in the last 168 hours. BNP (last 3 results) No results for input(s): PROBNP in the last 8760  hours. HbA1C: No results for input(s): HGBA1C in the last 72 hours. CBG: No results for input(s): GLUCAP in the last 168 hours. Lipid Profile: No results for input(s): CHOL, HDL, LDLCALC, TRIG, CHOLHDL, LDLDIRECT in the last 72 hours. Thyroid Function Tests: No results for input(s): TSH, T4TOTAL, FREET4, T3FREE, THYROIDAB in the last 72 hours. Anemia Panel: No results for input(s): VITAMINB12, FOLATE, FERRITIN, TIBC, IRON, RETICCTPCT in the last 72 hours. Sepsis Labs: No results for input(s): PROCALCITON, LATICACIDVEN in the last 168 hours.  No results found for this or any previous visit (from the past 240 hour(s)).       Radiology Studies: No results found.      Scheduled Meds: . ALPRAZolam  0.5 mg Oral TID  . B-complex with vitamin C  1 tablet Oral Daily  . cholecalciferol  5,000 Units Oral Daily  . divalproex  250 mg Oral Daily  . divalproex  500 mg Oral QHS  . doxycycline  100 mg Oral Q12H  . enoxaparin (LOVENOX) injection  40 mg Subcutaneous Q24H  . ibuprofen  400 mg Oral QID  . irbesartan  75 mg Oral Daily  . lidocaine  1 patch Transdermal Q24H  . metoprolol succinate  100 mg Oral QPM  . multivitamin with minerals  1 tablet Oral Daily  . nystatin cream   Topical BID  . sodium chloride flush  3 mL Intravenous Q12H   Continuous Infusions:   LOS: 6 days     Undrea Shipes Annett Brandi [name] Wordell, MD [facility] Pager [phone]  If 7PM-7AM, please contact night-coverage [email or web address] Password [email or web address] 08/14/2016, 1:23 PM

## 2016-08-14 NOTE — Progress Notes (Signed)
*  Preliminary Results* Bilateral lower extremity venous duplex completed. Bilateral lower extremities are negative for deep vein thrombosis. There is no evidence of Baker's cyst bilaterally.  08/14/2016 10:07 AM Gertie FeyMichelle Meagan Spease, BS, RVT, RDCS, RDMS

## 2016-08-15 DIAGNOSIS — F419 Anxiety disorder, unspecified: Secondary | ICD-10-CM

## 2016-08-15 MED ORDER — IBUPROFEN 200 MG PO TABS: 400.0000 mg | ORAL_TABLET | Freq: Three times a day (TID) | ORAL | 0 refills | Status: AC | PRN

## 2016-08-15 MED ORDER — ALPRAZOLAM 1 MG PO TABS: 1.0000 mg | ORAL_TABLET | Freq: Two times a day (BID) | ORAL | 0 refills | Status: AC

## 2016-08-15 MED ORDER — NYSTATIN 100000 UNIT/ML MT SUSP: 5.0000 mL | Freq: Four times a day (QID) | OROMUCOSAL | 0 refills | Status: AC

## 2016-08-15 MED ORDER — NYSTATIN 100000 UNIT/GM EX CREA: TOPICAL_CREAM | Freq: Two times a day (BID) | CUTANEOUS | 0 refills | Status: AC

## 2016-08-15 MED ORDER — TRAMADOL HCL 50 MG PO TABS: 50.0000 mg | ORAL_TABLET | Freq: Two times a day (BID) | ORAL | 0 refills | Status: AC | PRN

## 2016-08-15 NOTE — Discharge Summary (Signed)
Physician Discharge Summary  Brandi [name] ZOX:[id number] DOB: 06/29/1929 DOA: 08/06/2016  PCP: Brandi Stalling, MD  Admit date: 08/06/2016 Discharge date: 08/15/2016  Admitted From:  Home  Disposition:  SNF  Recommendations for Outpatient Follow-up:  1. Follow up with PCP in 1   Home Health: NA   Equipment/Devices: NA   Discharge Condition: Stable  CODE STATUS: Full  Diet recommendation: Regular    Brief/Interim Summary: This is a 83 year old female who presented to the hospital with a chief complaint of back and leg pain. The pain was described as severe, she was unable to bear weight, her back pain was refractory to outpatient medical therapy. On initial physical examination blood pressure was 154/68, heart rate 57, respiratory rate 18. She was awake and alert, lungs were clear to auscultation bilaterally, heart S1-S2 present rhythmic, abdomen was soft nontender, lower extremity is no edema. Sodium 135, potassium 3.7, chloride 103, bicarbonate 23, glucose 100, BUN 13, creatinine 0.86, white count 7.0, hemoglobin 12.3, hematocrit 35.2, platelets 209. Urinalysis was negative for infection. MRI showed L4/L5 disc extrusion, worsening from 2012 with possible L4 radiculitis, multifocal moderate to severe left lateral recess stenosis and moderate spinal stenosis.   The patient was admitted to the hospital with the working diagnosis of back/leg pain with ambulatory dysfunction due to lumbar spine disc disease.   1. Lumbar disc disease with L4 radiculitis. Patient was admitted to the medical floor for pain control and physical therapy. Neurosurgery Dr. Jordan Likes was contacted, with recommendation for Decadron 10 mg every 6 hours. Patient's pain improved with conservative therapy, patient received ibuprofen, Lidoderm, oxycodone and Ultram. By the time of discharge her pain is improved, she has been evaluated by physical therapy with recommendations for skilled nursing facility. Patient developed  toxic encephalopathy presumed to be related to steroids, this agents were discontinued in hospital.   2. Toxic encephalopathy with delirium. On January 3 she developed sever agitation, confusion, delirium. It was presumed to be related to high-dose steroids. Patient was placed on Depakote, Seroquel, psychiatry was consulted. She required restraints and one-to-one sitter. With conservative care her delirium improved to the point where she is off restraints and not requiring a sitter. Currently she is able to respond to simple questions, no current agitation. Patient will be discharged on her home dose of alprazolam, will discontinue Seroquel and Depakote for now.  3. Hypertension. Patient was continued on ARB, beta-blockade. Blood pressure systolic 131 by the time of discharge.   4. Thrush. Patient was placed on nystatin, plan 10 day therapy.    Discharge Diagnoses:  Principal Problem:   Acute delirium Active Problems:   Left leg pain   Back pain   Essential hypertension   GERD (gastroesophageal reflux disease)   Anxiety    Discharge Instructions   Allergies as of 08/15/2016      Reactions   Other    Unable to take any kind of generic drug due to intolerance. causes her hiatal hernia to be very over active.   Peanut-containing Drug Products Swelling   Sulfa Antibiotics Hives   Dizziness   Penicillins Swelling, Rash, Other (See Comments)   Has patient had a PCN reaction causing immediate rash, facial/tongue/throat swelling, SOB or lightheadedness with hypotension: Yes Has patient had a PCN reaction causing severe rash involving mucus membranes or skin necrosis: No Has patient had a PCN reaction that required hospitalization No Has patient had a PCN reaction occurring within the last 10 years:  If all of the above answers  are "NO", then may proceed with Cephalosporin use.      Medication List    TAKE these medications   ALPRAZolam 1 MG tablet Commonly known as:  XANAX Take 1  tablet (1 mg total) by mouth 2 (two) times daily.   aspirin 81 MG chewable tablet Chew by mouth daily.   b complex vitamins tablet Take 1 tablet by mouth daily.   ibuprofen 200 MG tablet Commonly known as:  ADVIL,MOTRIN Take 2 tablets (400 mg total) by mouth every 8 (eight) hours as needed for moderate pain. What changed:  when to take this  reasons to take this   lidocaine 5 % Commonly known as:  LIDODERM Place 1 patch onto the skin daily. Replace daily to lower back.   metoprolol succinate 100 MG 24 hr tablet Commonly known as:  TOPROL-XL Take 100 mg by mouth every evening. Take with or immediately following a meal.   multivitamin tablet Take 1 tablet by mouth daily.   nystatin 100000 UNIT/ML suspension Commonly known as:  MYCOSTATIN Take 5 mLs (500,000 Units total) by mouth 4 (four) times daily.   nystatin cream Commonly known as:  MYCOSTATIN Apply topically 2 (two) times daily.   ranitidine 150 MG capsule Commonly known as:  ZANTAC Take 150 mg by mouth 2 (two) times daily.   traMADol 50 MG tablet Commonly known as:  ULTRAM Take 1 tablet (50 mg total) by mouth every 12 (twelve) hours as needed for severe pain.   valsartan 80 MG tablet Commonly known as:  DIOVAN Take 80 mg by mouth daily.   Vitamin D3 5000 units Caps Take 5,000 Units by mouth daily.      Follow-up Information    [facility] Follow up.   Why:  nurse, physical therapy, social work SolicitorContact information: [address] [place] [address] [phone]        Brandi StallingNDIEGO,[name], MD Follow up in 1 week(s).   Specialty:  Internal Medicine Contact information: [address] [place] [id number] [phone]          Allergies  Allergen Reactions  . Other     Unable to take any kind of generic drug due to intolerance. causes her hiatal hernia to be very over active.  . Peanut-Containing Drug Products Swelling  . Sulfa Antibiotics Hives     Dizziness   . Penicillins Swelling, Rash and Other (See Comments)    Has patient had a PCN reaction causing immediate rash, facial/tongue/throat swelling, SOB or lightheadedness with hypotension: Yes Has patient had a PCN reaction causing severe rash involving mucus membranes or skin necrosis: No Has patient had a PCN reaction that required hospitalization No Has patient had a PCN reaction occurring within the last 10 years:  If all of the above answers are "NO", then may proceed with Cephalosporin use.    Consultations: Psychiatry  Procedures/Studies: Dg Lumbar Spine Complete  Result Date: 08/04/2016 CLINICAL DATA:  Low back and left hip pain since falling from bed 3 days ago. EXAM: LUMBAR SPINE - COMPLETE 4+ VIEW COMPARISON:  Radiographs 10/30/2007.  MRI 10/01/2010. FINDINGS: Numbering is as applied previously. The last open disc space is assigned L5-S1. Utilizing this numbering, there are small ribs at L1. The bones are demineralized. There is progressive multilevel spondylosis with a thoracolumbar scoliosis. No evidence of acute fracture or traumatic subluxation. Aortic and branch vessel atherosclerosis noted. IMPRESSION: Progressive scoliosis and spondylosis. No acute osseous findings seen. Electronically Signed   By: Chrissie NoaWilliam  Purcell Mouton [name].D.   On: 08/04/2016 15:43   Dg Tibia/fibula Left  Result Date: 08/04/2016 CLINICAL DATA:  Left lower extremity pain after falling out of bed 3 days ago. EXAM: LEFT TIBIA AND FIBULA - 2 VIEW COMPARISON:  None. FINDINGS: There is no evidence of fracture or other focal bone lesions. Soft tissues are unremarkable. Severe degenerative changes seen involving the left knee. IMPRESSION: No significant abnormality seen involving the left tibia or fibula. Electronically Signed   By: Lupita Raider, [name].D.   On: 08/04/2016 15:45   Ct Head Wo Contrast  Result Date: 08/10/2016 CLINICAL DATA:  Altered mental status. EXAM: CT HEAD WITHOUT CONTRAST TECHNIQUE: Contiguous  axial images were obtained from the base of the skull through the vertex without intravenous contrast. COMPARISON:  CT scan of August 04, 2016. FINDINGS: Brain: Mild chronic ischemic white matter disease is noted. No mass effect or midline shift is noted. Ventricular size is within normal limits. There is no evidence of mass lesion, hemorrhage or acute infarction. Vascular: No hyperdense vessel or unexpected calcification. Skull: Normal. Negative for fracture or focal lesion. Sinuses/Orbits: No acute finding. Other: None. IMPRESSION: Mild chronic ischemic white matter disease. No acute intracranial abnormality seen. Electronically Signed   By: Lupita Raider, [name].D.   On: 08/10/2016 12:26   Ct Head Wo Contrast  Result Date: 08/04/2016 CLINICAL DATA:  Fall from bed. Uncertain about head injury. Initial encounter. EXAM: CT HEAD WITHOUT CONTRAST TECHNIQUE: Contiguous axial images were obtained from the base of the skull through the vertex without intravenous contrast. COMPARISON:  None. FINDINGS: Brain: No evidence of acute infarction, hemorrhage, hydrocephalus, extra-axial collection or mass lesion/mass effect. Mild for age chronic microvascular ischemic change in the cerebral white matter. Vascular: Atherosclerotic calcification. Skull: Negative Sinuses/Orbits: Negative IMPRESSION: No evidence of intracranial injury or fracture. Electronically Signed   By: Marnee Spring [name].D.   On: 08/04/2016 15:52   Mr Lumbar Spine Wo Contrast  Result Date: 08/06/2016 CLINICAL DATA:  83 year old female status post fall out of bed 6 days ago. Continued lumbar back pain radiating to the left hip and foot. Subsequent encounter. EXAM: MRI LUMBAR SPINE WITHOUT CONTRAST TECHNIQUE: Multiplanar, multisequence MR imaging of the lumbar spine was performed. No intravenous contrast was administered. COMPARISON:  Lumbar radiographs 08/04/2016.  Lumbar MRI 10/01/2010. FINDINGS: Segmentation: Normal as demonstrated on the recent  radiographs, and this is the same numbering system used on the 2012 MRI. Alignment: Moderate dextroconvex lumbar scoliosis is chronic and not significantly changed since 2012. Intermittent mild retrolisthesis in the lumbar spine appears stable. Vertebrae: No lumbar compression fracture. Moderate to severe chronic upper lumbar endplate spurring. No marrow edema or evidence of acute osseous abnormality. Visible sacrum and SI joints intact. Conus medullaris: Extends to the L1-L2 level and appears normal. Paraspinal and other soft tissues: Stable visualized abdominal viscera. There is mild edema in the left erector spinae muscles and also a small segment of the left psoas muscle at the L4 level (series 5, image 15). See the L4-L5 findings described below. Otherwise negative paraspinal soft tissues. Disc levels: No lower thoracic spinal stenosis. T12-L1:  Stable mild disc bulge and facet hypertrophy. L1-L2: Chronic circumferential but right eccentric disc bulge. Progressed right far lateral disc and endplate spurring. Mild to moderate facet hypertrophy. Mild right lateral recess stenosis and mild right foraminal stenosis not significantly changed. L2-L3: Chronic circumferential disc bulge and endplate spurring eccentric to the left. Moderate facet hypertrophy. Chronic moderate to severe right and mild left lateral  recess stenosis appears stable. Chronic moderate right L2 foraminal stenosis is stable. L3-L4: Left eccentric circumferential disc bulge and moderate facet hypertrophy greater on the left appears stable. Trace right facet joint fluid is chronic. Mild bilateral lateral recess stenosis is stable. L4-L5: New T2 and STIR heterogeneity within the disc space. Chronic circumferential and left eccentric disc bulge, but new moderate to large left foraminal disc extrusion, with disc fragment estimated at 15 mm. See series 3, image 11 and series 7, image 26. Superimposed severe chronic facet hypertrophy at this level with  increased facet joint fluid. Severe left lateral recess stenosis has progressed. Moderate spinal stenosis has progressed. Very severe left L4 foraminal stenosis is new. L5-S1: Stable circumferential disc bulge with severe facet hypertrophy. Stable mild to moderate left lateral recess stenosis. Stable mild left L5 foraminal stenosis. IMPRESSION: 1. Symptomatic level felt to be L4-L5 where a large left foraminal disc extrusion is new since 2012. The extruded disc fragment is estimated at 15 mm and results in new severe left foraminal stenosis. Query left L4 radiculitis. 2. Multifactorial moderate to severe left lateral recess stenosis (descending left L5 nerve root level) and moderate spinal stenosis have also progressed at L4-L5. 3. Other levels appear stable since 2012 ; underlying moderate lumbar scoliosis with widespread chronically advanced spinal degeneration. Electronically Signed   By: Odessa Fleming [name].D.   On: 08/06/2016 14:35   Dg Chest Port 1 View  Result Date: 08/04/2016 CLINICAL DATA:  Pt c/o generalized CP and mild SOB after falling from her bed 4 days ago. Hx HTN, GERD, A-fib, former smoker EXAM: PORTABLE CHEST 1 VIEW COMPARISON:  11/24/2006 FINDINGS: Cardiac silhouette is normal in size. No mediastinal or hilar masses or evidence of adenopathy. Clear lungs.  No pleural effusion.  No pneumothorax. Skeletal structures are demineralized but grossly intact. IMPRESSION: No active disease. Electronically Signed   By: Amie Portland [name].D.   On: 08/04/2016 18:16   Dg Foot Complete Left  Result Date: 08/04/2016 CLINICAL DATA:  Left foot pain after falling out of bed 3 days ago. EXAM: LEFT FOOT - COMPLETE 3+ VIEW COMPARISON:  None. FINDINGS: No fracture or dislocation is noted. Joint spaces are intact. Spurring of posterior calcaneus is noted. Mild degenerative joint disease of the first metatarsophalangeal joint is noted. No soft tissue abnormality is noted. IMPRESSION: Mild degenerative joint disease of first  metatarsophalangeal joint. No acute abnormality seen in the left foot. Electronically Signed   By: Lupita Raider, [name].D.   On: 08/04/2016 15:49   Dg Hip Unilat W Or Wo Pelvis 2-3 Views Left  Result Date: 08/04/2016 CLINICAL DATA:  Left hip pain after falling out of bed 3 days ago. EXAM: DG HIP (WITH OR WITHOUT PELVIS) 2-3V LEFT COMPARISON:  None. FINDINGS: There is no evidence of hip fracture or dislocation. There is no evidence of arthropathy or other focal bone abnormality. IMPRESSION: Normal left hip. Electronically Signed   By: Lupita Raider, [name].D.   On: 08/04/2016 15:43      Subjective: Patient feeling better, no nausea or vomiting. No chest pain or palpitation, no agitation, mild confusion.   Discharge Exam: Vitals:   08/15/16 0641 08/15/16 0900  BP: (!) 143/70 (!) 131/37  Pulse: 63 66  Resp: 16 16  Temp: 98.4 F (36.9 C)    Vitals:   08/14/16 1402 08/14/16 2106 08/15/16 0641 08/15/16 0900  BP: (!) 151/91 (!) 145/63 (!) 143/70 (!) 131/37  Pulse: 71 91 63 66  Resp: 16  16 16 16   Temp: 97.5 F (36.4 C) 98.2 F (36.8 C) 98.4 F (36.9 C)   TempSrc: Oral Oral Oral   SpO2: 100% 96% 96% 96%  Weight:      Height:        General: Pt is alert, awake, not in acute distress Cardiovascular: RRR, S1/S2 +, no rubs, no gallops Respiratory: CTA bilaterally, no wheezing, no rhonchi Abdominal: Soft, NT, ND, bowel sounds + Extremities: no edema, no cyanosis    The results of significant diagnostics from this hospitalization (including imaging, microbiology, ancillary and laboratory) are listed below for reference.     Microbiology: No results found for this or any previous visit (from the past 240 hour(s)).   Labs: BNP (last 3 results) No results for input(s): BNP in the last 8760 hours. Basic Metabolic Panel:  Recent Labs Lab 08/10/16 0651 08/11/16 0801 08/13/16 0454  NA 142 135  --   K 3.9 3.3*  --   CL 107 99*  --   CO2 26 28  --   GLUCOSE 106* 145*  --   BUN  31* 22*  --   CREATININE 0.63 0.66 0.65  CALCIUM 9.5 9.3  --    Liver Function Tests:  Recent Labs Lab 08/10/16 0651 08/11/16 0801  AST 44* 36  ALT 27 28  ALKPHOS 54 53  BILITOT 1.3* 1.3*  PROT 6.6 6.5  ALBUMIN 4.3 4.1   No results for input(s): LIPASE, AMYLASE in the last 168 hours. No results for input(s): AMMONIA in the last 168 hours. CBC: No results for input(s): WBC, NEUTROABS, HGB, HCT, MCV, PLT in the last 168 hours. Cardiac Enzymes: No results for input(s): CKTOTAL, CKMB, CKMBINDEX, TROPONINI in the last 168 hours. BNP: Invalid input(s): POCBNP CBG: No results for input(s): GLUCAP in the last 168 hours. D-Dimer No results for input(s): DDIMER in the last 72 hours. Hgb A1c No results for input(s): HGBA1C in the last 72 hours. Lipid Profile No results for input(s): CHOL, HDL, LDLCALC, TRIG, CHOLHDL, LDLDIRECT in the last 72 hours. Thyroid function studies No results for input(s): TSH, T4TOTAL, T3FREE, THYROIDAB in the last 72 hours.  Invalid input(s): FREET3 Anemia work up No results for input(s): VITAMINB12, FOLATE, FERRITIN, TIBC, IRON, RETICCTPCT in the last 72 hours. Urinalysis    Component Value Date/Time   COLORURINE YELLOW 08/08/2016 1800   APPEARANCEUR CLEAR 08/08/2016 1800   LABSPEC 1.008 08/08/2016 1800   PHURINE 5.0 08/08/2016 1800   GLUCOSEU NEGATIVE 08/08/2016 1800   HGBUR NEGATIVE 08/08/2016 1800   BILIRUBINUR NEGATIVE 08/08/2016 1800   KETONESUR NEGATIVE 08/08/2016 1800   PROTEINUR NEGATIVE 08/08/2016 1800   NITRITE NEGATIVE 08/08/2016 1800   LEUKOCYTESUR NEGATIVE 08/08/2016 1800   Sepsis Labs Invalid input(s): PROCALCITONIN,  WBC,  LACTICIDVEN Microbiology No results found for this or any previous visit (from the past 240 hour(s)).   Time coordinating discharge: 45 minutes  SIGNED:   Coralie Keens, MD  [facility] 08/15/2016, 11:26 AM Pager   If 7PM-7AM, please contact night-coverage [email or web address] Password  [email or web address]

## 2016-08-15 NOTE — Progress Notes (Signed)
Physical Therapy Treatment Patient Details Name: Brandi [name] MRN: [id number] DOB: 03/22/1929 Today's Date: 08/15/2016    History of Present Illness Brandi [name] is a 83 y.o. female with a medical history of hypertension, anxiety, presented to the emergency department with complaints of back and leg pain. Patient fell on Christmas Eve. MRI lumbar spine L4-L5 large left foraminal disc extrusion, left severe left foraminal stenosis.  During course of recovery, pt became confused and agitated    PT Comments    Pt cooperative with PT, she amb very slowly today although with good tolerance; will benefit from SNF  Follow Up Recommendations  SNF     Equipment Recommendations  Rolling walker with 5" wheels    Recommendations for Other Services       Precautions / Restrictions Precautions Precautions: Fall Restrictions Weight Bearing Restrictions: No    Mobility  Bed Mobility Overal bed mobility: Needs Assistance Bed Mobility: Supine to Sit     Supine to sit: Min assist        Transfers Overall transfer level: Needs assistance Equipment used: Rolling walker (2 wheeled) Transfers: Sit to/from Stand Sit to Stand: Min assist Stand pivot transfers: Min assist          Ambulation/Gait Ambulation/Gait assistance: Min assist;Min guard Ambulation Distance (Feet): 86 Feet Assistive device: Rolling walker (2 wheeled) Gait Pattern/deviations: Step-through pattern;Decreased stride length Gait velocity: much slower today compared to previous sessions   General Gait Details: intermittent assist to maneuver RW, cues for safety   Stairs            Wheelchair Mobility    Modified Rankin (Stroke Patients Only)       Balance                                    Cognition Arousal/Alertness: Awake/alert Behavior During Therapy: WFL for tasks assessed/performed Overall Cognitive Status: Impaired/Different from baseline       Memory: Decreased  short-term memory Following Commands: Follows one step commands consistently     Problem Solving: Slow processing;Requires verbal cues General Comments: overall dulled affect today compared to previous sessions, cannot recall if she is going to rehab but states she cannot  go home by herself    Exercises General Exercises - Lower Extremity Ankle Circles/Pumps: AROM;Both;10 reps Heel Slides: AAROM;Both;5 reps    General Comments        Pertinent Vitals/Pain Pain Assessment: No/denies pain Pain Score: 2  Pain Location: low back Pain Descriptors / Indicators: Sore Pain Intervention(s): Monitored during session;Repositioned    Home Living                      Prior Function            PT Goals (current goals can now be found in the care plan section) Acute Rehab PT Goals Patient Stated Goal: get well so I can take care of myself PT Goal Formulation: Patient unable to participate in goal setting Time For Goal Achievement: 08/25/16 Potential to Achieve Goals: Good Progress towards PT goals: Progressing toward goals    Frequency    Min 3X/week      PT Plan Current plan remains appropriate    Co-evaluation             End of Session Equipment Utilized During Treatment: Gait belt Activity Tolerance: Patient tolerated treatment well Patient left: in chair;with call  bell/phone within reach;with chair alarm set     Time: 1150-1209 PT Time Calculation (min) (ACUTE ONLY): 19 min  Charges:  $Gait Training: 8-22 mins                    G Codes:      Shoni Quijas 08/15/2016, 12:29 PM

## 2016-08-15 NOTE — Clinical Social Work Placement (Signed)
   CLINICAL SOCIAL WORK PLACEMENT  NOTE  Date:  08/15/2016  Patient Details  Name: Brandi PaciniFrances A Hinderer MRN: [id number] Date of Birth: 07-31-1929  Clinical Social Work is seeking post-discharge placement for this patient at the Skilled  Nursing Facility level of care (*CSW will initial, date and re-position this form in  chart as items are completed):  Yes   Patient/family provided with [facility] Clinical Social Work Department's list of facilities offering this level of care within the geographic area requested by the patient (or if unable, by the patient's family).  Yes   Patient/family informed of their freedom to choose among providers that offer the needed level of care, that participate in Medicare, Medicaid or managed care program needed by the patient, have an available bed and are willing to accept the patient.  Yes   Patient/family informed of [facility]'s ownership interest in [facility] and [facility], as well as of the fact that they are under no obligation to receive care at these facilities.  PASRR submitted to EDS on 08/13/16     PASRR number received on 08/13/16     Existing PASRR number confirmed on       FL2 transmitted to all facilities in geographic area requested by pt/family on 08/13/16     FL2 transmitted to all facilities within larger geographic area on       Patient informed that his/her managed care company has contracts with or will negotiate with certain facilities, including the following:        Yes   Patient/family informed of bed offers received.  [facility]     Physician recommends and patient chooses bed at      [facility] on 08/15/16.  Patient to be transferred to facility by [facility]     Patient family notified on 08/15/16 of transfer.  Name of family member notified:  SISTER     PHYSICIAN       Additional Comment: Pt / sister are in agreement with d/[facility] to  [facility] today.[facility] transport is required. Sister is aware out of pocket costs may be associated with [facility] transport. Scripts included in d/c packet. D/C Summary sent to SNF for review. # for report provided to nsg.   _______________________________________________ Royetta AsalHaidinger, Sohrab Keelan Lee, LCSW  [phone] 08/15/2016, 11:56 AM

## 2016-08-15 NOTE — Progress Notes (Signed)
Occupational Therapy Treatment Patient Details Name: Brandi PaciniFrances A Alcorn MRN: [id number] DOB: Feb 17, 1929 Today's Date: 08/15/2016    History of present illness Brandi PaciniFrances A Matsen is a 83 y.o. female with a medical history of hypertension, anxiety, presented to the emergency department with complaints of back and leg pain. Patient fell on Christmas Eve. MRI lumbar spine L4-L5 large left foraminal disc extrusion, left severe left foraminal stenosis.  During course of recovery, pt became confused and agitated   OT comments  Pt very participative this OT session  Follow Up Recommendations  SNF    Equipment Recommendations  3 in 1 bedside commode    Recommendations for Other Services      Precautions / Restrictions Precautions Precautions: Fall       Mobility Bed Mobility Overal bed mobility: Needs Assistance Bed Mobility: Supine to Sit     Supine to sit: Min assist        Transfers Overall transfer level: Needs assistance Equipment used: Rolling walker (2 wheeled) Transfers: Sit to/from Stand Sit to Stand: Min assist Stand pivot transfers: Min assist                ADL Overall ADL's : Needs assistance/impaired     Grooming: Oral care;Wash/dry face;Brushing hair;Sitting;Set up               Lower Body Dressing: Moderate assistance;Sit to/from stand;Cueing for safety;Cueing for sequencing;Cueing for compensatory techniques   Toilet Transfer: Minimal assistance;Stand-pivot;BSC;RW;Cueing for safety;Cueing for sequencing   Toileting- Clothing Manipulation and Hygiene: Moderate assistance;Sit to/from stand;Cueing for safety;Cueing for sequencing;Cueing for compensatory techniques         General ADL Comments: pt agreed to sit in chair at end of OT session                Cognition   [facility] tasks assessed/performed                    General Comments: pt with improved cognition this OT visit- pt knows she needs A and cant go  home alone        Pertinent Vitals/ Pain       Pain Score: 2  Pain Location: low back Pain Descriptors / Indicators: Sore Pain Intervention(s): Monitored during session;Repositioned         Frequency  Min 2X/week        Progress Toward Goals  OT Goals(current goals can now be found in the care plan section)  Progress towards OT goals: Progressing toward goals  Acute Rehab OT Goals Patient Stated Goal: get well so I can take care of myself  Plan Discharge plan remains appropriate    Co-evaluation                 End of Session Equipment Utilized During Treatment: Gait belt;Rolling walker   Activity Tolerance Patient tolerated treatment well   Patient Left in chair;with chair alarm set;with call bell/phone within reach   Nurse Communication Mobility status        Time: [id number] OT Time Calculation (min): 30 min  Charges: OT General Charges $OT Visit: 1 Procedure OT Treatments $Self Care/Home Management : 23-37 mins  Guthrie Lemme D 08/15/2016, 10:14 AM

## 2017-01-06 DEATH — deceased

## 2017-04-08 IMAGING — MR MR LUMBAR SPINE W/O CM
4 of 5 series · 17 of 48 positions shown · non-contrast
Comparison: Lumbar radiographs 08/04/2016.  Lumbar MRI 10/01/2010.

CLINICAL DATA: 87-year-old female status post fall out of bed 6
days ago. Continued lumbar back pain radiating to the left hip and
foot. Subsequent encounter.

EXAM:
MRI LUMBAR SPINE WITHOUT CONTRAST
TECHNIQUE: Multiplanar, multisequence MR imaging of the lumbar spine was
performed. No intravenous contrast was administered.

[Series 3: T1 · sagittal · 4.0mm · 0.51mm/px · 3 of 17 slices shown (1 of 2)]
[im 3/17]
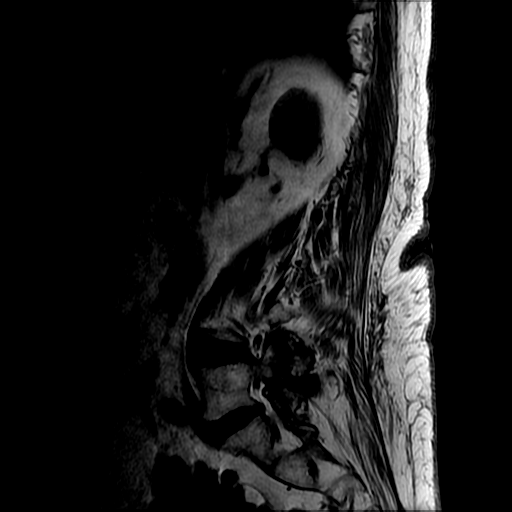
[im 9/17]
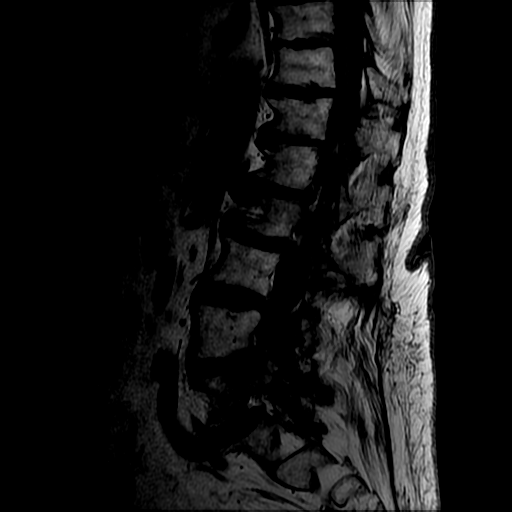
[im 14/17]
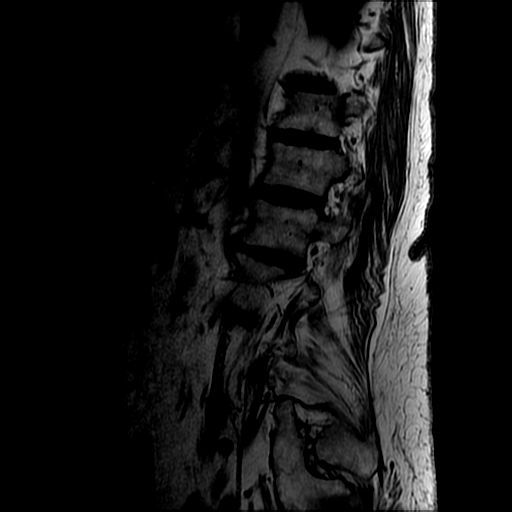

[Series 4: T2 post-contrast · sagittal · 4.0mm · 0.51mm/px · 7 of 17 slices shown]
[im 1/17]
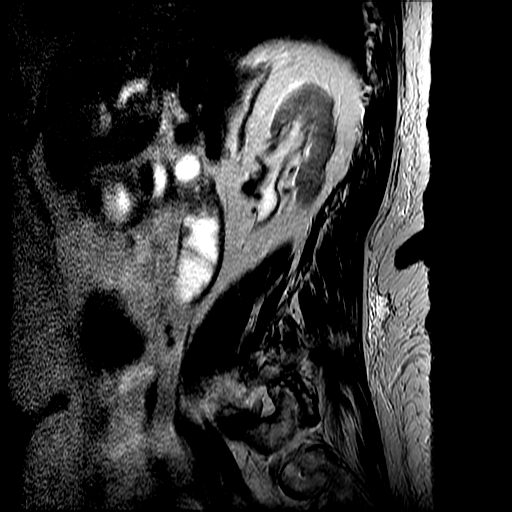
[im 3/17]
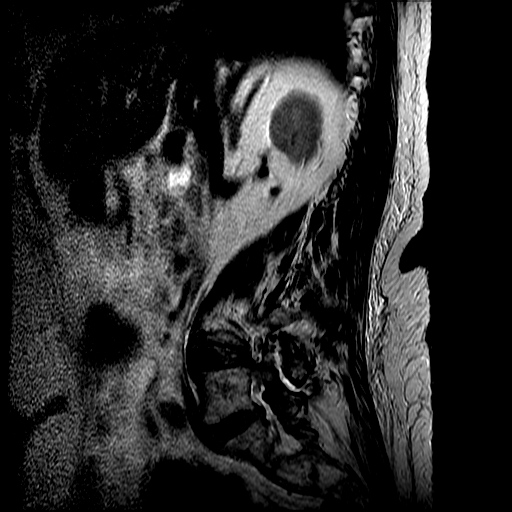
[im 6/17]
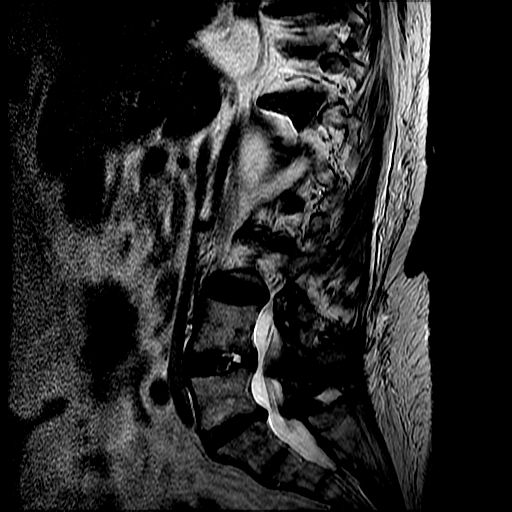
[im 9/17]
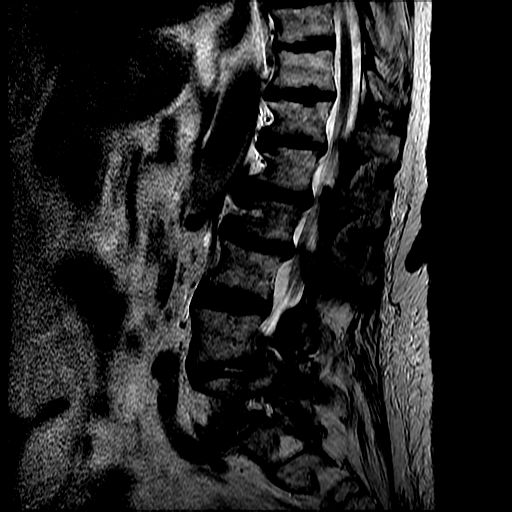
[im 11/17]
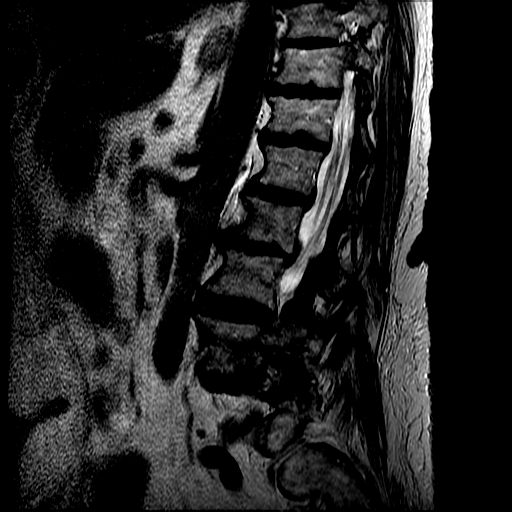
[im 14/17]
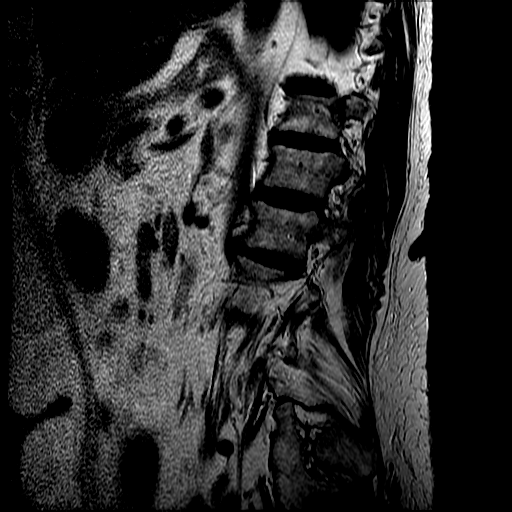
[im 17/17]
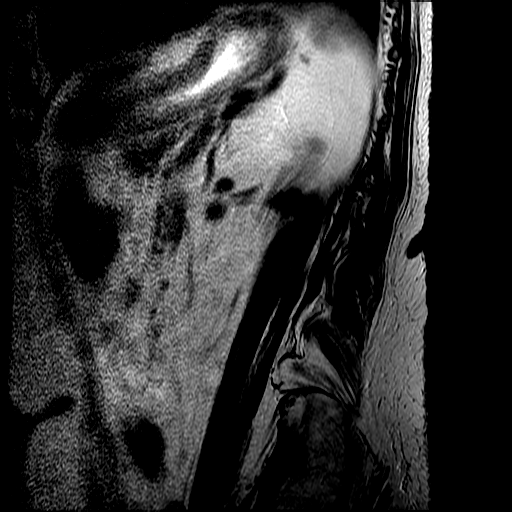

[Series 6: T2 · axial · 4.0mm · 0.39mm/px · z∈[-18,+141]mm · 4 of 38 slices shown]
[im 1/38]
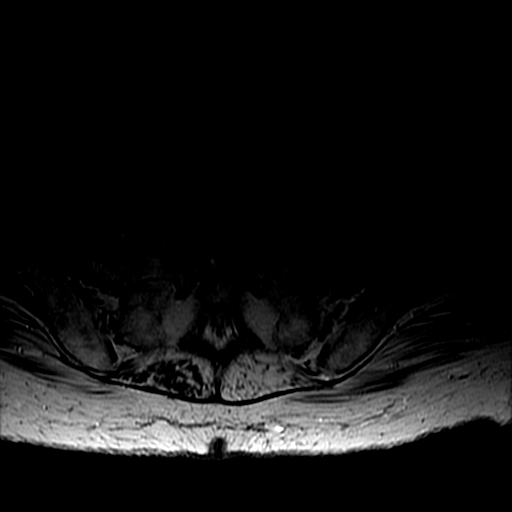
[im 6/38]
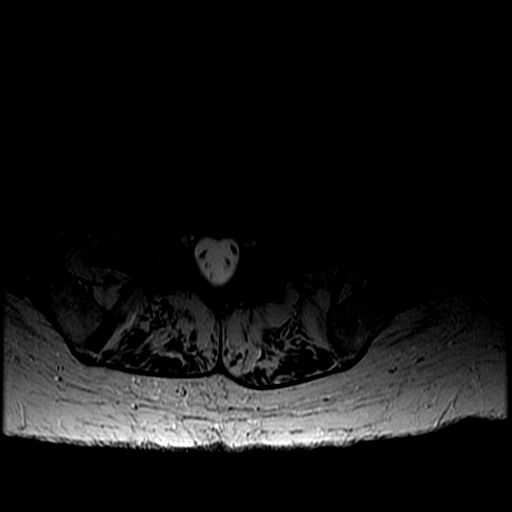
[im 20/38]
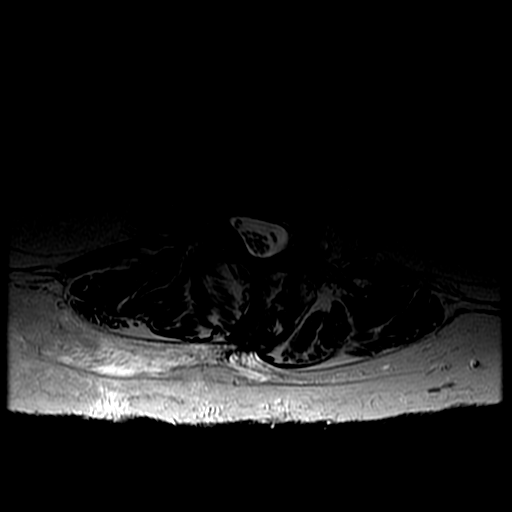
[im 32/38]
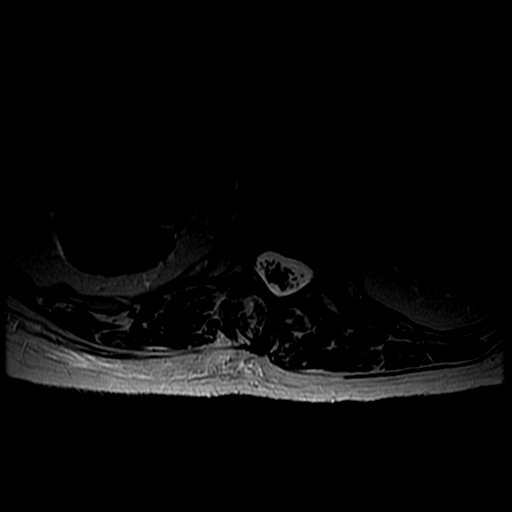

[Series 7: T1 · axial · 4.0mm · 0.39mm/px · z∈[+7,+141]mm · 3 of 38 slices shown (2 of 2)]
[im 6/38]
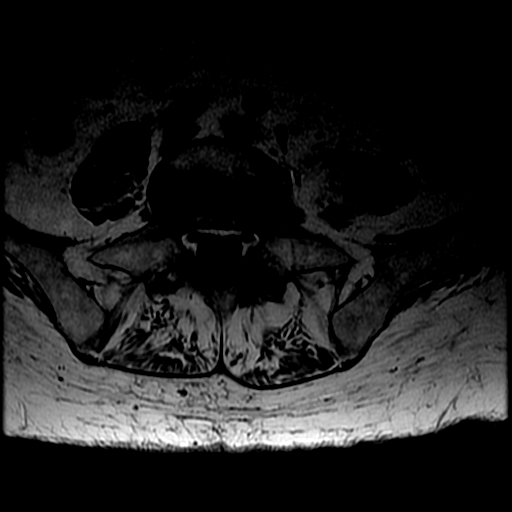
[im 20/38]
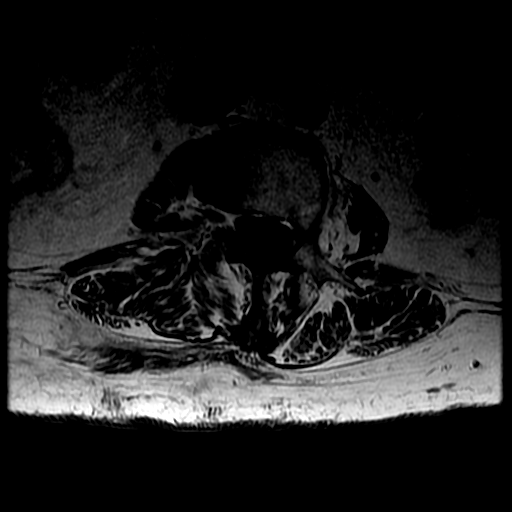
[im 32/38]
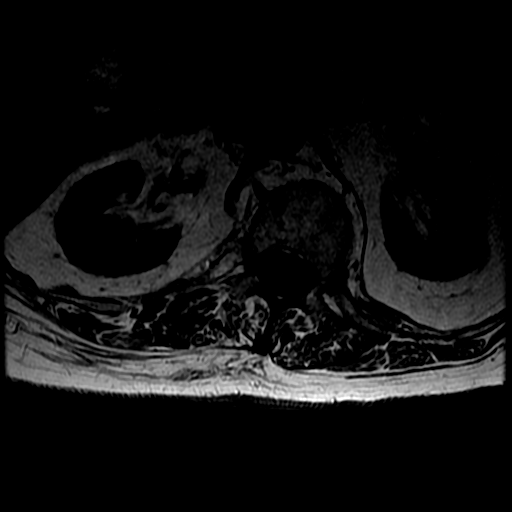

[17 of 48 positions shown; findings below may reference images not displayed]

FINDINGS: Segmentation: Normal as demonstrated on the recent radiographs, and
this is the same numbering system used on the 4474 MRI.

Alignment: Moderate dextroconvex lumbar scoliosis is chronic and not
significantly changed since 4474. Intermittent mild retrolisthesis
in the lumbar spine appears stable.

Vertebrae: No lumbar compression fracture. Moderate to severe
chronic upper lumbar endplate spurring. No marrow edema or evidence
of acute osseous abnormality. Visible sacrum and SI joints intact.

Conus medullaris: Extends to the L1-L2 level and appears normal.

Paraspinal and other soft tissues: Stable visualized abdominal
viscera.

There is mild edema in the left erector spinae muscles and also a
small segment of the left psoas muscle at the L4 level (series 5,
image 15). See the L4-L5 findings described below. Otherwise
negative paraspinal soft tissues.

Disc levels:

No lower thoracic spinal stenosis.

T12-L1:  Stable mild disc bulge and facet hypertrophy.

L1-L2: Chronic circumferential but right eccentric disc bulge.
Progressed right far lateral disc and endplate spurring. Mild to
moderate facet hypertrophy. Mild right lateral recess stenosis and
mild right foraminal stenosis not significantly changed.

L2-L3: Chronic circumferential disc bulge and endplate spurring
eccentric to the left. Moderate facet hypertrophy. Chronic moderate
to severe right and mild left lateral recess stenosis appears
stable. Chronic moderate right L2 foraminal stenosis is stable.

L3-L4: Left eccentric circumferential disc bulge and moderate facet
hypertrophy greater on the left appears stable. Trace right facet
joint fluid is chronic. Mild bilateral lateral recess stenosis is
stable.

L4-L5: New T2 and STIR heterogeneity within the disc space. Chronic
circumferential and left eccentric disc bulge, but new moderate to
large left foraminal disc extrusion, with disc fragment estimated at
15 mm. See series 3, image 11 and series 7, image 26. Superimposed
severe chronic facet hypertrophy at this level with increased facet
joint fluid. Severe left lateral recess stenosis has progressed.
Moderate spinal stenosis has progressed. Very severe left L4
foraminal stenosis is new.

L5-S1: Stable circumferential disc bulge with severe facet
hypertrophy. Stable mild to moderate left lateral recess stenosis.
Stable mild left L5 foraminal stenosis.
IMPRESSION: 1. Symptomatic level felt to be L4-L5 where a large left foraminal
disc extrusion is new since 4474. The extruded disc fragment is
estimated at 15 mm and results in new severe left foraminal
stenosis. Query left L4 radiculitis.
2. Multifactorial moderate to severe left lateral recess stenosis
(descending left L5 nerve root level) and moderate spinal stenosis
have also progressed at L4-L5.
3. Other levels appear stable since 4474 ; underlying moderate
lumbar scoliosis with widespread chronically advanced spinal
degeneration.
